# Patient Record
Sex: Female | Born: 1955
Health system: Southern US, Community
[De-identification: ages and names within clinical notes are randomized; demographics above are authoritative.]

## PROBLEM LIST (undated history)

## (undated) DIAGNOSIS — F419 Anxiety disorder, unspecified: Secondary | ICD-10-CM

## (undated) DIAGNOSIS — F32A Depression, unspecified: Secondary | ICD-10-CM

## (undated) DIAGNOSIS — K219 Gastro-esophageal reflux disease without esophagitis: Secondary | ICD-10-CM

## (undated) DIAGNOSIS — C801 Malignant (primary) neoplasm, unspecified: Secondary | ICD-10-CM

## (undated) DIAGNOSIS — G4730 Sleep apnea, unspecified: Secondary | ICD-10-CM

## (undated) DIAGNOSIS — I10 Essential (primary) hypertension: Secondary | ICD-10-CM

## (undated) DIAGNOSIS — F329 Major depressive disorder, single episode, unspecified: Secondary | ICD-10-CM

## (undated) DIAGNOSIS — M1990 Unspecified osteoarthritis, unspecified site: Secondary | ICD-10-CM

## (undated) DIAGNOSIS — E119 Type 2 diabetes mellitus without complications: Secondary | ICD-10-CM

## (undated) DIAGNOSIS — I2510 Atherosclerotic heart disease of native coronary artery without angina pectoris: Secondary | ICD-10-CM

## (undated) DIAGNOSIS — I4891 Unspecified atrial fibrillation: Principal | ICD-10-CM

## (undated) DIAGNOSIS — E785 Hyperlipidemia, unspecified: Secondary | ICD-10-CM

## (undated) HISTORY — DX: Malignant (primary) neoplasm, unspecified: C80.1

## (undated) HISTORY — DX: Depression, unspecified: F32.A

## (undated) HISTORY — DX: Sleep apnea, unspecified: G47.30

## (undated) HISTORY — DX: Major depressive disorder, single episode, unspecified: F32.9

---

## 1978-04-04 HISTORY — PX: TONSILLECTOMY: SUR1361

## 2005-07-03 DIAGNOSIS — C801 Malignant (primary) neoplasm, unspecified: Secondary | ICD-10-CM

## 2005-07-03 HISTORY — DX: Malignant (primary) neoplasm, unspecified: C80.1

## 2005-12-03 HISTORY — PX: BREAST SURGERY: SHX581

## 2009-01-28 ENCOUNTER — Ambulatory Visit: Payer: Self-pay | Admitting: Unknown Physician Specialty

## 2009-09-13 ENCOUNTER — Ambulatory Visit: Payer: Self-pay | Admitting: Unknown Physician Specialty

## 2010-12-14 LAB — EJECTION FRACTION PERCENTAGE: Left Ventricular Ejection Fraction: 60 %

## 2011-08-23 ENCOUNTER — Encounter: Payer: Self-pay | Admitting: Family

## 2011-09-03 ENCOUNTER — Encounter: Payer: Self-pay | Admitting: Family

## 2012-02-07 ENCOUNTER — Ambulatory Visit: Payer: Self-pay | Admitting: Family

## 2012-03-04 ENCOUNTER — Ambulatory Visit: Payer: Self-pay | Admitting: Family

## 2012-04-04 ENCOUNTER — Ambulatory Visit: Payer: Self-pay | Admitting: Family

## 2013-01-18 ENCOUNTER — Ambulatory Visit: Payer: Self-pay | Admitting: Unknown Physician Specialty

## 2013-04-04 HISTORY — PX: COLONOSCOPY: SHX174

## 2013-09-03 ENCOUNTER — Ambulatory Visit: Payer: Self-pay | Admitting: Gastroenterology

## 2013-09-05 LAB — PATHOLOGY REPORT

## 2014-05-12 ENCOUNTER — Encounter: Payer: Self-pay | Admitting: General Surgery

## 2014-05-12 ENCOUNTER — Ambulatory Visit (INDEPENDENT_AMBULATORY_CARE_PROVIDER_SITE_OTHER): Payer: 59 | Admitting: General Surgery

## 2014-05-12 VITALS — BP 132/76 | HR 74 | Resp 14 | Ht 63.0 in | Wt 249.0 lb

## 2014-05-12 DIAGNOSIS — Z853 Personal history of malignant neoplasm of breast: Secondary | ICD-10-CM

## 2014-05-12 NOTE — Patient Instructions (Signed)
Return in 3 months for office visit.

## 2014-05-12 NOTE — Progress Notes (Signed)
Patient ID: Tammy Webb, female   DOB: 06/21/1955, 59 y.o.   MRN: [id number]  Chief Complaint  Patient presents with  . Other    right breast nodule    HPI Tammy Webb is a 59 y.o. female  Here for assessment of right breast nodule. She has a history of  right breast cancer treated with chemotherapy, bilateral mastectomies, and subsequent reconstruction. She first noticed the nodule two months ago, she thinks the area has got bigger. No pain.  HPI  Past Medical History  Diagnosis Date  . Cancer 07/2005    Left Breast     Past Surgical History  Procedure Laterality Date  . Tonsillectomy  1980  . Cesarean section  1993  . Breast surgery Bilateral 12/2005    Mastectomies with reconstruction    Family History  Problem Relation Age of Onset  . Breast cancer Maternal Aunt   . Ovarian cancer Paternal Aunt     Social History History  Substance Use Topics  . Smoking status: Never Smoker   . Smokeless tobacco: Not on file  . Alcohol Use: No    Allergies  Allergen Reactions  . Morphine And Related Rash    Current Outpatient Prescriptions  Medication Sig Dispense Refill  . ADVAIR DISKUS 250-50 MCG/DOSE AEPB     . EPIPEN 2-PAK 0.3 MG/0.3ML SOAJ injection     . ibuprofen (ADVIL,MOTRIN) 200 MG tablet Take 200 mg by mouth every 6 (six) hours as needed.    Marland Kitchen losartan (COZAAR) 100 MG tablet     . Multiple Vitamins-Minerals (MULTIVITAMIN WITH MINERALS) tablet Take 1 tablet by mouth daily.     No current facility-administered medications for this visit.    Review of Systems Review of Systems  Constitutional: Negative.   HENT: Negative.   Respiratory: Negative.     Blood pressure 132/76, pulse 74, resp. rate 14, height 5\' 3"  (1.6 m), weight 249 lb (112.946 kg).  Physical Exam Physical Exam  Constitutional: She is oriented to person, place, and time. She appears well-developed and well-nourished.  Eyes: Conjunctivae are normal. No scleral icterus.  Neck: Neck supple.   Cardiovascular: Normal rate, regular rhythm and normal heart sounds.   Pulmonary/Chest: Effort normal and breath sounds normal.  Abdominal: Soft. Bowel sounds are normal. There is no hepatomegaly.  Lymphadenopathy:    She has no cervical adenopathy.    She has no axillary adenopathy.  Neurological: She is alert and oriented to person, place, and time.  Skin: Skin is warm and dry.  Breast exam- implants noted on both sides -they both are shifted more laterally. The sternum is broad with slight carinum deformity.  On either side of this there is mild irregular feel and the costal cartilages are easily palpable. No defined mass or lump noted.  Data Reviewed Prior information from her initial cancer treatment. Pt had a 2 cm left breast CA-ER/PR pos, her 2 neg. She had left MRM, right mastectomy and bil implants. Also had chemo and 61yrs of antihormonal therapy.  Assessment    Area of nodularity in the medial right breast area- reveals no defined mass or lump. Pt reassured. History of right breast cancer.     Plan   Will recheck in 2-3 mos. Also requested CA 27-29        SANKAR,SEEPLAPUTHUR G 05/13/2014, 8:11 AM

## 2014-05-13 ENCOUNTER — Telehealth: Payer: Self-pay | Admitting: *Deleted

## 2014-05-13 ENCOUNTER — Encounter: Payer: Self-pay | Admitting: General Surgery

## 2014-05-13 LAB — CANCER ANTIGEN 27.29: CA 27.29: 12.2 U/mL (ref 0.0–38.6)

## 2014-05-13 NOTE — Progress Notes (Signed)
Quick Note:  Inform pt labs are normal. F/u as scheduled ______ 

## 2014-05-13 NOTE — Telephone Encounter (Signed)
-----   Message from Christene Lye, MD sent at 05/13/2014  7:13 AM EST ----- Inform pt labs are normal. F/u as scheduled

## 2014-05-14 NOTE — Telephone Encounter (Signed)
Notified patient as instructed, patient pleased. Discussed follow-up appointments, patient agrees  

## 2014-08-14 ENCOUNTER — Ambulatory Visit: Payer: Self-pay | Admitting: General Surgery

## 2015-02-23 NOTE — Telephone Encounter (Signed)
Pt called said that she will be going out of town on Wed, needs refill on Lipitor, [facility] [place]

## 2015-02-24 MED ORDER — ATORVASTATIN CALCIUM 40 MG PO TABS
40 MG | ORAL_TABLET | Freq: Every evening | ORAL | 1 refills | Status: DC
Start: 2015-02-24 — End: 2015-07-31

## 2015-03-17 ENCOUNTER — Encounter: Attending: Cardiovascular Disease | Primary: Family Medicine

## 2015-03-23 ENCOUNTER — Ambulatory Visit
Admit: 2015-03-23 | Discharge: 2015-03-23 | Payer: PRIVATE HEALTH INSURANCE | Attending: Cardiovascular Disease | Primary: Family Medicine

## 2015-03-23 DIAGNOSIS — I10 Essential (primary) hypertension: Secondary | ICD-10-CM

## 2015-03-23 MED ORDER — NITROGLYCERIN 0.4 MG SL SUBL
0.4 MG | ORAL_TABLET | SUBLINGUAL | 3 refills | Status: DC | PRN
Start: 2015-03-23 — End: 2018-05-21

## 2015-03-23 NOTE — Telephone Encounter (Signed)
Patient request new SL Nitro ERX [facility] [place] during OV with MAI today. Message to MAI.

## 2015-03-23 NOTE — Progress Notes (Signed)
[facility] Cardiology Office Note    DATE of SERVICE: 03/23/15  TIME of SERVICE: 11:32 AM                   Reason for Visit:  Chief Complaint   Patient presents with   ??? 1 Year Follow Up        History of Present Illness:   Sally MontgomeryKaren L Cotham is a 59 y.o. female who returns for follow-up of coronary artery disease status post DES to the left anterior descending in 2010.     She is doing well.  She denies chest discomfort or shortness of breath.    12-lead EKG, which I personally reviewed today, shows sinus rhythm without concerning changes.    Past Medical History:  Past Medical History   Diagnosis Date   ??? Atherosclerotic heart disease of native coronary artery without angina pectoris    ??? Chest pain, unspecified    ??? Essential (primary) hypertension    ??? Family history of ischemic heart disease and other diseases of the circulatory system    ??? GERD (gastroesophageal reflux disease)    ??? Hyperlipidemia    ??? Presence of coronary angioplasty implant and graft         Past Surgical History  Past Surgical History   Procedure Laterality Date   ??? Hysterectomy, total abdominal     ??? Coronary angioplasty with stent placement  01/2009     PCI DES LAD       Family History  Family History   Problem Relation Age of Onset   ??? Heart Attack Father      myocardial infarction at less than 9760        Social History  Social History   Substance Use Topics   ??? Smoking status: Never Smoker   ??? Smokeless tobacco: None   ??? Alcohol use No        Medications:    Current Outpatient Prescriptions:   ???  atorvastatin (LIPITOR) 40 MG tablet, Take 1 tablet by mouth nightly Take As Directed, Disp: 90 tablet, Rfl: 1  ???  aspirin 81 MG EC tablet, Take 81 mg by mouth daily Take As Directed, Disp: , Rfl:   ???  metoprolol succinate (TOPROL XL) 25 MG extended release tablet, Take 25 mg by mouth daily Take As Directed, Disp: , Rfl:   ???  sodium chloride (SALINE MIST) 0.65 % nasal spray, 1 spray by Nasal route as needed for Congestion Take As Directed, Disp: ,  Rfl:   ???  albuterol sulfate HFA 108 (90 BASE) MCG/ACT inhaler, Inhale 2 puffs into the lungs every 6 hours as needed for Wheezing, Disp: , Rfl:   ???  nitroGLYCERIN (NITROSTAT) 0.4 MG SL tablet, Place 0.4 mg under the tongue every 5 minutes as needed for Chest pain Dissolve 1 tab under tongue at first sign of chest pain. May repeat every 5 minutes until relief is obtained. If pain persists after taking 3 tabs in a 15-minute period, or the pain is different than is typically experienced, call 9-1-1 immediately., Disp: , Rfl:     Review of Systems:  Review of Systems   Constitutional: Negative for diaphoresis, fatigue and fever.   HENT: Negative for congestion and nosebleeds.    Eyes: Negative for visual disturbance.   Respiratory: Negative for cough, shortness of breath and wheezing.    Cardiovascular: Negative for chest pain, palpitations and leg swelling.   Gastrointestinal: Negative for abdominal pain, blood in  stool, nausea and vomiting.   Genitourinary: Negative for hematuria.   Musculoskeletal: Positive for myalgias. Negative for arthralgias.   Skin: Negative for rash.   Neurological: Negative for dizziness and syncope.   Hematological: Does not bruise/bleed easily.   Psychiatric/Behavioral: Negative for dysphoric mood.        Denies Depression       Physical Examination:  Vitals: Blood pressure 132/72, pulse 67, height 5' (1.524 m), weight 178 lb 1.6 oz (80.8 kg). Body mass index is 34.78 kg/(m^2).  Physical Exam   Constitutional: She is oriented to person, place, and time. She appears well-developed and well-nourished.   HENT:   Head: Normocephalic.   Eyes: Conjunctivae are normal. No scleral icterus.   Neck: No JVD present.   Cardiovascular: Regular rhythm, S1 normal, S2 normal and intact distal pulses.  PMI is not displaced.  Exam reveals no S3 and no S4.    No murmur heard.  Pulmonary/Chest: Breath sounds normal. She has no wheezes. She has no rales.   Abdominal: Soft. Bowel sounds are normal. There is no  tenderness.   Musculoskeletal: She exhibits no edema.   Neurological: She is alert and oriented to person, place, and time.   Skin: Skin is warm and dry. No rash noted.   Psychiatric: She has a normal mood and affect.       Laboratory Tests:   No results found for: NA, K, CL, CO2, BUN, CREATININE, GLUCOSE, CALCIUM   No results found for: NA, K, CL, CO2, BUN, CREATININE, GLUCOSE, CALCIUM, PROT, LABALBU, BILITOT, ALKPHOS, AST, ALT, LABGLOM, GFRAA, AGRATIO, GLOB      "No results found for: CHOL  No results found for: TRIG  No results found for: HDL  No results found for: LDLCALC, LDLCHOLESTEROL  No results found for: LABVLDL, VLDL  No results found for: CHOLHDLRATIO"      Cardiac Tests:    Echo (date: 12/14/2010):  Normal stress echo      Cardiac Catheterization (date:  01/19/2009): Single-vessel disease, successful DES to the mid left anterior descending          Assessment and Plan:  1.  Coronary artery disease.  She is status post left anterior descending stenting in 2010.  She has no angina pectoris.  Continue aggressive secondary prevention measures.  She has some muscle soreness that she is concerned might be due to her statin.  I suggested trying coenzyme Q10.         Garland Hincapie A. Milinda Antis, MD, [facility]

## 2015-07-30 ENCOUNTER — Encounter

## 2015-07-30 NOTE — Telephone Encounter (Signed)
Pt is calling for a [facility] [place] [address]

## 2015-07-31 MED ORDER — ATORVASTATIN CALCIUM 40 MG PO TABS
40 | ORAL_TABLET | Freq: Every evening | ORAL | 1 refills | Status: DC
Start: 2015-07-31 — End: 2016-02-22

## 2015-07-31 NOTE — Telephone Encounter (Signed)
Called patient and left message that when she is on Lipitor we must check her liver function every 6 months.  Therefore, we are sending an order in the mail and this needs done asap.

## 2015-07-31 NOTE — Telephone Encounter (Signed)
Mailed lab order to patient.

## 2015-08-11 LAB — HEPATIC FUNCTION PANEL
ALT: 28 U/L
AST: 16 U/L
Albumin: 4
Alkaline Phosphatase: 73 U/L
Bilirubin, Direct: 0.3 mg/dL
Total Bilirubin: 0.8 mg/dL (ref 0.1–1.4)
Total Protein: 7.3 g/dL

## 2015-08-11 LAB — LIPID PANEL
Chol/HDL Ratio: 1
Cholesterol, Total: 113 mg/dL
HDL: 80 mg/dL — AB (ref 35–70)
LDL Calculated: 19 mg/dL (ref 0–160)
Triglycerides: 70 mg/dL

## 2015-09-02 NOTE — Telephone Encounter (Signed)
Patient called asking if office received labs she had done earlier this month at pcp's office.  Nothing in chart or folder.  Called for results, received fax and put copy in Linda's folder to review.  Patient would like a copy mailed to her.

## 2015-09-03 NOTE — Telephone Encounter (Signed)
Called pt with L&Ls. The hepatic panel was normal and lipids at goal.  The HDL was 80 with an LDL of 19.  TC was 113.  Patient states she notices some slight myalgias and asked if she could take it every other day.  I told her to try this and I will let Dr. Milinda AntisIler know. I also told her to call us if the myalgias do not get better or worsen. She verbalized understanding.  We will check her L&Ls with a CPK in 3 mos.

## 2015-09-07 NOTE — Telephone Encounter (Signed)
Lab results already called to pt and sent to Dr. Milinda AntisIler.

## 2015-09-11 NOTE — Telephone Encounter (Signed)
Patient called and said she has not received copy of recent labs she requested over a week ago and asked if another copy could be mailed to her and this was done.

## 2015-09-15 ENCOUNTER — Telehealth

## 2015-09-15 NOTE — Telephone Encounter (Signed)
Patient needs her next lab orders sent to her.

## 2015-09-16 NOTE — Telephone Encounter (Signed)
Lab order put in Linda's folder.

## 2015-09-17 MED ORDER — METOPROLOL SUCCINATE ER 25 MG PO TB24
25 MG | ORAL_TABLET | ORAL | 3 refills | Status: DC
Start: 2015-09-17 — End: 2016-11-06

## 2015-09-17 NOTE — Telephone Encounter (Signed)
Lab orders, with note to get them done in November, mailed to pt.

## 2015-09-17 NOTE — Telephone Encounter (Signed)
Had labs in May, 2017

## 2015-09-18 NOTE — Telephone Encounter (Signed)
error 

## 2015-11-05 ENCOUNTER — Encounter: Payer: Self-pay | Admitting: Obstetrics and Gynecology

## 2015-11-05 ENCOUNTER — Ambulatory Visit (INDEPENDENT_AMBULATORY_CARE_PROVIDER_SITE_OTHER): Payer: 59 | Admitting: Obstetrics and Gynecology

## 2015-11-05 VITALS — BP 116/71 | HR 60 | Wt 234.2 lb

## 2015-11-05 DIAGNOSIS — B372 Candidiasis of skin and nail: Secondary | ICD-10-CM

## 2015-11-05 DIAGNOSIS — N898 Other specified noninflammatory disorders of vagina: Secondary | ICD-10-CM | POA: Diagnosis not present

## 2015-11-05 DIAGNOSIS — R8290 Unspecified abnormal findings in urine: Secondary | ICD-10-CM | POA: Diagnosis not present

## 2015-11-05 DIAGNOSIS — Z853 Personal history of malignant neoplasm of breast: Secondary | ICD-10-CM

## 2015-11-05 DIAGNOSIS — N950 Postmenopausal bleeding: Secondary | ICD-10-CM | POA: Diagnosis not present

## 2015-11-05 LAB — POCT URINALYSIS DIPSTICK
Bilirubin, UA: NEGATIVE
Blood, UA: NEGATIVE
GLUCOSE UA: NEGATIVE
KETONES UA: NEGATIVE
Leukocytes, UA: NEGATIVE
Nitrite, UA: NEGATIVE
Protein, UA: NEGATIVE
Urobilinogen, UA: 0.2
pH, UA: 7

## 2015-11-05 NOTE — Progress Notes (Signed)
GYNECOLOGY CLINIC PROGRESS NOTE  Subjective:    Tammy Webb is a 60 y.o. [phone] post-menopausal female (~ 9 years) who presents to establish care, and for concerns regarding vaginal bleeding. She has been menopausal for ~ 15 years. Currently on no HRT.  Bleeding is described as less flow than a normal period and has occurred 1 time, approximately 4 months ago. Other menopausal symptoms include: none. Workup to date: none.  Menstrual History: Menarche age: 72 No LMP recorded. Patient is postmenopausal.  Last pap smear last year, May 2016 (with Dr. Rebecka Apley). Denies h/o abnormal pap smears.  H/o sigmoidoscopy 3-4 years ago. H/o colon polyps.     Past Medical History:  Diagnosis Date  . Cancer ([place]) 07/2005   Left Breast   . Sleep apnea     Family History  Problem Relation Age of Onset  . Breast cancer Maternal Aunt   . Ovarian cancer Paternal Aunt    Past Surgical History:  Procedure Laterality Date  . BREAST SURGERY Bilateral 12/2005   Mastectomies with reconstruction  . CESAREAN SECTION  1993  . TONSILLECTOMY  1980    Social History   Social History  . Marital status: Divorced    Spouse name: N/A  . Number of children: N/A  . Years of education: N/A   Occupational History  . Not on file.   Social History Main Topics  . Smoking status: Never Smoker  . Smokeless tobacco: Never Used  . Alcohol use No  . Drug use: No  . Sexual activity: Not Currently    Birth control/ protection: Post-menopausal   Other Topics Concern  . Not on file   Social History Narrative  . No narrative on file    Current Outpatient Prescriptions on File Prior to Visit  Medication Sig Dispense Refill  . ADVAIR DISKUS 250-50 MCG/DOSE AEPB     . EPIPEN 2-PAK 0.3 MG/0.3ML SOAJ injection     . ibuprofen (ADVIL,MOTRIN) 200 MG tablet Take 200 mg by mouth every 6 (six) hours as needed.    Marland Kitchen losartan (COZAAR) 100 MG tablet     . Multiple Vitamins-Minerals (MULTIVITAMIN WITH MINERALS)  tablet Take 1 tablet by mouth daily.     No current facility-administered medications on file prior to visit.     Allergies  Allergen Reactions  . Morphine And Related Rash     Review of Systems A comprehensive review of systems was negative except for: Genitourinary: positive for vaginal discharge and vaginal odor for ~ 1 year, and urinary odor. Integument/breast: positive for skin rash occuring intermittently beneath pannus and groin folds    Objective:    BP 116/71   Pulse 60   Wt 234 lb 3.2 oz (106.2 kg)   BMI 41.49 kg/m   General appearance: alert and no distress, morbidly obese Neck: no adenopathy, no carotid bruit, no JVD, supple, symmetrical, trachea midline and thyroid not enlarged, symmetric, no tenderness/mass/nodules Back: symmetric, no curvature. ROM normal. No CVA tenderness. Lungs: clear to auscultation bilaterally Breasts: negative findings: skin normal, palpation negative for masses or nodules, no palpable axillary lymphadenopathy, positive findings: implants bilaterally, s/p bilateral mastectomy changes. Heart: regular rate and rhythm, S1, S2 normal, no murmur, click, rub or gallop Abdomen: soft, non-tender; bowel sounds normal; no masses,  no organomegaly Pelvic: external genitalia normal, rectovaginal septum normal.  Vagina with small amount of thin white discharge. Mild atrophy present.  Cervix normal appearing, no lesions and no motion tenderness.  Uterus mobile, nontender, normal  shape and size.  Adnexae non-palpable, nontender bilaterally.  Extremities: extremities normal, atraumatic, no cyanosis or edema Skin: Skin color, texture, turgor normal. No rashes or lesions Lymph nodes: Cervical, supraclavicular, and axillary nodes normal. Neurologic: Grossly normal     Labs:  Results for orders placed or performed in visit on 11/05/15  POCT urinalysis dipstick  Result Value Ref Range   Color, UA yellow    Clarity, UA clear    Glucose, UA neg    Bilirubin,  UA neg    Ketones, UA neg    Spec Grav, UA <=1.005    Blood, UA neg    pH, UA 7.0    Protein, UA neg    Urobilinogen, UA 0.2    Nitrite, UA neg    Leukocytes, UA Negative Negative    Assessment:    Postmenopausal bleeding    Vaginal discharge with odor History of breast cancer Urinary odor  Plan:   - Discussed etiologies of postmenopausal bleeding, concern about precancerous/hyperplasia or cancerous etiology (5 to 10% percent of cases). Also discussed role of unopposed estrogen exposure in leading to thickened or proliferative endometrium; and its possible correlation with endometrial hyperplasia/carcinoma.  Discussed that obesity is linked to endometrial pathology given that adipose cells produce extra estrogen (estrone) which can cause the endometrium to have a significant amount of estrogen exposure.  However, she was reassured that endometrial atrophy and endometrial polyps are the most common causes of postmenopausal bleeding.  Uterine bleeding in postmenopausal women is usually light and self-limited. Exclusion of cancer is the main objective; therefore, treatment is usually unnecessary once cancer has been excluded.  The primary goal in the diagnostic evaluation of postmenopausal women with uterine bleeding is to exclude malignancy.  Will order pelvic ultrasound to assess endometrium.  If thickened, will proceed with further evaluation with endometrial biopsy.  Will notify patient of results of ultrasound by phone.  - Vaginal discharge present.  Sent Nuswab.  - History of breast cancer. Patient has been seen by General Surgery within past year. Has been released from Oncology.  - Urinary odor, UA performed today, wnl. - H/o yeast infections under skin folds, patient notes that she has been given a prescription for Nystatin powder and cream which she uses as needed.  Recommended using daily, especially in the summer months.  Exam today notes no skin rash.   A total of 45 minutes were  spent face-to-face with the patient during the encounter with greater than 50% dealing with counseling and coordination of care.   Rubie Maid, MD [facility]

## 2015-11-08 LAB — NUSWAB VAGINITIS PLUS (VG+)
CANDIDA GLABRATA, NAA: NEGATIVE
CHLAMYDIA TRACHOMATIS, NAA: NEGATIVE
Candida albicans, NAA: NEGATIVE
NEISSERIA GONORRHOEAE, NAA: NEGATIVE
TRICH VAG BY NAA: NEGATIVE

## 2015-11-09 ENCOUNTER — Telehealth: Payer: Self-pay

## 2015-11-09 NOTE — Telephone Encounter (Signed)
-----   Message from Rubie Maid, MD sent at 11/09/2015  1:38 PM EDT ----- Please inform patient that vaginal swab returned negative.

## 2015-11-09 NOTE — Telephone Encounter (Signed)
Called pt, no answer. LM for pt to call back.  

## 2015-11-10 NOTE — Telephone Encounter (Signed)
Called pt unable to leave message as voicemail is full.

## 2015-11-11 ENCOUNTER — Ambulatory Visit (INDEPENDENT_AMBULATORY_CARE_PROVIDER_SITE_OTHER): Payer: 59

## 2015-11-11 DIAGNOSIS — N950 Postmenopausal bleeding: Secondary | ICD-10-CM | POA: Diagnosis not present

## 2015-11-13 ENCOUNTER — Telehealth: Payer: Self-pay | Admitting: Obstetrics and Gynecology

## 2015-11-13 NOTE — Telephone Encounter (Signed)
Called pt, no answer. LM for pt informing her of previous calls as well as negative results.

## 2015-11-13 NOTE — Telephone Encounter (Signed)
PT IS CALLING WANTING TO KNOW THE RESULTS OF HER PAP FROM LAST WEEK, SHE STATED THAT YOU CAN CALL AND LEAVE ON HER VM AT NUMBER ABOVE.

## 2016-02-19 LAB — BASIC METABOLIC PANEL
BUN: 11 mg/dL
CO2: 27 mmol/L
Calcium: 9 mg/dL
Chloride: 104 mmol/L
Creatinine: 0.76
Gfr Calculated: >60
Glucose: 87 mg/dL
Potassium: 3.8 mmol/L
Sodium: 139 mmol/L

## 2016-02-19 LAB — LIPID PANEL
Chol/HDL Ratio: 1
Chol/HDL Ratio: 1
Cholesterol, Total: 107 mg/dL
Cholesterol, Total: 107 mg/dL
HDL: 74 mg/dL — AB (ref 35–70)
HDL: 74 mg/dL — ABNORMAL HIGH (ref 35–70)
LDL Calculated: 18 mg/dL (ref 0–160)
LDL Calculated: 18 mg/dL (ref 0–160)
Triglycerides: 77 mg/dL
Triglycerides: 77 mg/dL

## 2016-02-19 LAB — COMPREHENSIVE METABOLIC PANEL
ALT: 36 U/L
AST: 20 U/L
Albumin: 4
Alkaline Phosphatase: 73 U/L
Anion Gap: 8 mmol/L
BUN: 11 mg/dL
CO2: 27 mmol/L
Calcium: 9 mg/dL
Chloride: 104 mmol/L
Creatinine: 0.76
Gfr Calculated: >60
Glucose: 87 mg/dL
Potassium: 3.8 mmol/L
Sodium: 139 mmol/L
Total Bilirubin: 0.8 mg/dL (ref 0.1–1.4)
Total Protein: 7.3

## 2016-02-19 LAB — CK
Total CK: 113 U/L
Total CK: 113 U/L

## 2016-02-22 NOTE — Telephone Encounter (Signed)
Pt called said that she had labs drawn [facility] [place] and would like copy mailed to her. She also said that she only take Lipitor ev other day due to muscle aches, was advised by pharmacist that our ofc declined refill for her. She said she did not need refill and that it was auto refill by pharmacy.

## 2016-02-29 NOTE — Telephone Encounter (Signed)
-----   Message from Armandina StammerMark A Iler, MD sent at 02/23/2016  4:23 PM EST -----  Reviewed results.  Please forward to patient if not already done.

## 2016-02-29 NOTE — Telephone Encounter (Signed)
Called pt with normal CMP and lipids with lipids at goal. She would like a copy of the labs mailed to her.  I also reminded her that she has an appt on December 28th.

## 2016-03-01 NOTE — Telephone Encounter (Signed)
Mailed copy of recent labs to patient.

## 2016-03-31 ENCOUNTER — Encounter: Attending: Cardiovascular Disease | Primary: Family Medicine

## 2016-04-05 ENCOUNTER — Ambulatory Visit
Admit: 2016-04-05 | Discharge: 2016-04-05 | Payer: PRIVATE HEALTH INSURANCE | Attending: Cardiovascular Disease | Primary: Family Medicine

## 2016-04-05 DIAGNOSIS — I10 Essential (primary) hypertension: Secondary | ICD-10-CM

## 2016-04-05 NOTE — Progress Notes (Signed)
[facility] Cardiology Office Note    DATE of SERVICE: 04/05/16    TIME of SERVICE: 3:06 PM                   Reason for Visit:  Chief Complaint   Patient presents with   ??? 1 Year Follow Up        History of Present Illness:   Sally MontgomeryKaren L Nicolini is a 61 y.o. female who returns for follow-up of coronary artery disease status post DES to the left anterior descending in 2010.  She is doing well.  She denies chest discomfort or shortness of breath.    12-lead EKG, which I personally reviewed today, shows sinus rhythm without concerning changes.    Past Medical History:  Past Medical History:   Diagnosis Date   ??? Atherosclerotic heart disease of native coronary artery without angina pectoris    ??? Chest pain, unspecified    ??? Essential (primary) hypertension    ??? Family history of ischemic heart disease and other diseases of the circulatory system    ??? GERD (gastroesophageal reflux disease)    ??? Hyperlipidemia    ??? Presence of coronary angioplasty implant and graft         Past Surgical History  Past Surgical History:   Procedure Laterality Date   ??? CORONARY ANGIOPLASTY WITH STENT PLACEMENT  01/2009    PCI DES LAD   ??? HYSTERECTOMY, TOTAL ABDOMINAL         Family History  Family History   Problem Relation Age of Onset   ??? Heart Attack Father      myocardial infarction at less than 1960        Social History  Social History   Substance Use Topics   ??? Smoking status: Never Smoker   ??? Smokeless tobacco: Never Used   ??? Alcohol use No        Medications:    Current Outpatient Prescriptions:   ???  atorvastatin (LIPITOR) 40 MG tablet, Take 40 mg by mouth every other day, Disp: , Rfl:   ???  metoprolol succinate (TOPROL XL) 25 MG extended release tablet, TAKE 1 TABLET BY MOUTH EVERY DAY, Disp: 90 tablet, Rfl: 3  ???  aspirin 81 MG EC tablet, Take 81 mg by mouth daily Take As Directed, Disp: , Rfl:   ???  sodium chloride (SALINE MIST) 0.65 % nasal spray, 1 spray by Nasal route as needed for Congestion Take As Directed, Disp: , Rfl:   ???   nitroGLYCERIN (NITROSTAT) 0.4 MG SL tablet, Place 1 tablet under the tongue every 5 minutes as needed for Chest pain Dissolve 1 tab under tongue at first sign of chest pain. May repeat every 5 minutes until relief is obtained. If pain persists after taking 3 tabs in a 15-minute period,  call 9-1-1 immediately., Disp: 25 tablet, Rfl: 3    Review of Systems:  Review of Systems   Constitutional: Negative for diaphoresis, fatigue and fever.   HENT: Negative for congestion and nosebleeds.    Eyes: Negative for visual disturbance.   Respiratory: Negative for cough, shortness of breath and wheezing.    Cardiovascular: Negative for chest pain, palpitations and leg swelling.   Gastrointestinal: Negative for abdominal pain, blood in stool, nausea and vomiting.   Genitourinary: Negative for hematuria.   Musculoskeletal: Positive for myalgias. Negative for arthralgias.   Skin: Negative for rash.   Neurological: Negative for dizziness and syncope.   Hematological: Does not bruise/bleed  easily.   Psychiatric/Behavioral: Negative for dysphoric mood.        Denies Depression       Physical Examination:  Vitals: Blood pressure 138/88, pulse 75, height 5' (1.524 m), weight 176 lb 3.2 oz (79.9 kg). Body mass index is 34.41 kg/m??.  Physical Exam   Constitutional: She is oriented to person, place, and time. She appears well-developed and well-nourished.   HENT:   Head: Normocephalic.   Eyes: Conjunctivae are normal. No scleral icterus.   Neck: No JVD present.   Cardiovascular: Regular rhythm, S1 normal, S2 normal and intact distal pulses.  PMI is not displaced.  Exam reveals no S3 and no S4.    No murmur heard.  Pulmonary/Chest: Breath sounds normal. She has no wheezes. She has no rales.   Abdominal: Soft. Bowel sounds are normal. There is no tenderness.   Musculoskeletal: She exhibits no edema.   Neurological: She is alert and oriented to person, place, and time.   Skin: Skin is warm and dry. No rash noted.   Psychiatric: She has a  normal mood and affect.       Laboratory Tests:   Lab Results   Component Value Date    NA 139 02/19/2016    NA 139 02/19/2016    K 3.8 02/19/2016    K 3.8 02/19/2016    CL 104 02/19/2016    CL 104 02/19/2016    CO2 27 02/19/2016    CO2 27 02/19/2016    BUN 11 02/19/2016    BUN 11 02/19/2016    CREATININE 0.76 02/19/2016    CREATININE 0.76 02/19/2016    GLUCOSE 87 02/19/2016    GLUCOSE 87 02/19/2016    CALCIUM 9.0 02/19/2016    CALCIUM 9.0 02/19/2016      Lab Results   Component Value Date    NA 139 02/19/2016    NA 139 02/19/2016    K 3.8 02/19/2016    K 3.8 02/19/2016    CL 104 02/19/2016    CL 104 02/19/2016    CO2 27 02/19/2016    CO2 27 02/19/2016    BUN 11 02/19/2016    BUN 11 02/19/2016    CREATININE 0.76 02/19/2016    CREATININE 0.76 02/19/2016    GLUCOSE 87 02/19/2016    GLUCOSE 87 02/19/2016    CALCIUM 9.0 02/19/2016    CALCIUM 9.0 02/19/2016    PROT 7.3 08/11/2015    LABALBU 4.0 02/19/2016    BILITOT 0.8 02/19/2016    ALKPHOS 73 02/19/2016    AST 20 02/19/2016    ALT 36 02/19/2016    LABGLOM >60 02/19/2016    LABGLOM >60 02/19/2016         "  Lab Results   Component Value Date    CHOL 107 02/19/2016    CHOL 107 02/19/2016    CHOL 113 08/11/2015     Lab Results   Component Value Date    TRIG 77 02/19/2016    TRIG 77 02/19/2016    TRIG 70 08/11/2015     Lab Results   Component Value Date    HDL 74 (A) 02/19/2016    HDL 74 (H) 02/19/2016    HDL 80 (A) 08/11/2015     Lab Results   Component Value Date    LDLCALC 18 02/19/2016    LDLCALC 18 02/19/2016    LDLCALC 19 08/11/2015       Cardiac Tests:    Echo (date: 12/14/2010):  Normal stress  echo      Cardiac Catheterization (date:  01/19/2009): Single-vessel disease, successful DES to the mid left anterior descending          Assessment and Plan:  1.  Coronary artery disease.  She is status post left anterior descending stenting in 2010.  She has no angina pectoris.  Continue aggressive secondary prevention measures.  She developed myalgias on daily  atorvastatin.  These are improved by taking the medicine every other day and also taking some coenzyme Q10.  Her numbers are excellent as above.         Kharma Sampsel A. Milinda Antis, MD, [facility]

## 2016-04-07 DIAGNOSIS — J301 Allergic rhinitis due to pollen: Secondary | ICD-10-CM | POA: Diagnosis not present

## 2016-04-13 DIAGNOSIS — I208 Other forms of angina pectoris: Secondary | ICD-10-CM | POA: Diagnosis not present

## 2016-04-13 DIAGNOSIS — J301 Allergic rhinitis due to pollen: Secondary | ICD-10-CM | POA: Diagnosis not present

## 2016-04-19 DIAGNOSIS — E119 Type 2 diabetes mellitus without complications: Secondary | ICD-10-CM | POA: Diagnosis not present

## 2016-04-28 DIAGNOSIS — J301 Allergic rhinitis due to pollen: Secondary | ICD-10-CM | POA: Diagnosis not present

## 2016-04-28 DIAGNOSIS — M7050 Other bursitis of knee, unspecified knee: Secondary | ICD-10-CM | POA: Diagnosis not present

## 2016-04-28 DIAGNOSIS — M25561 Pain in right knee: Secondary | ICD-10-CM | POA: Diagnosis not present

## 2016-05-02 NOTE — Telephone Encounter (Signed)
Name of Caller: Sally Wong    Contact phone number: [phone] (home)     Relationship to Patient: patient                                                                                     Chief Complaint/Reason for Call: Called to inform doctor that her script for atorvastatin requires a PA. Stated she will be out of medication in a few more days.    Best time of day caller can be reached: Anytime    Did this call require a physician to be paged?: No    If Yes who did you page? NA    Patient advised that office/PCP has 24-48 business hours to return their call: Yes

## 2016-05-03 DIAGNOSIS — Z7689 Persons encountering health services in other specified circumstances: Secondary | ICD-10-CM | POA: Diagnosis not present

## 2016-05-03 NOTE — Telephone Encounter (Signed)
error 

## 2016-05-05 MED ORDER — ATORVASTATIN CALCIUM 40 MG PO TABS
40 | ORAL_TABLET | ORAL | 1 refills | Status: DC
Start: 2016-05-05 — End: 2016-09-04

## 2016-05-09 DIAGNOSIS — J301 Allergic rhinitis due to pollen: Secondary | ICD-10-CM | POA: Diagnosis not present

## 2016-05-18 DIAGNOSIS — G4733 Obstructive sleep apnea (adult) (pediatric): Secondary | ICD-10-CM | POA: Diagnosis not present

## 2016-05-25 ENCOUNTER — Encounter: Payer: Self-pay | Admitting: Obstetrics and Gynecology

## 2016-05-25 ENCOUNTER — Ambulatory Visit (INDEPENDENT_AMBULATORY_CARE_PROVIDER_SITE_OTHER): Payer: 59 | Admitting: Obstetrics and Gynecology

## 2016-05-25 VITALS — BP 140/78 | HR 61 | Ht 63.0 in | Wt 233.4 lb

## 2016-05-25 DIAGNOSIS — Z01419 Encounter for gynecological examination (general) (routine) without abnormal findings: Secondary | ICD-10-CM

## 2016-05-25 DIAGNOSIS — Z124 Encounter for screening for malignant neoplasm of cervix: Secondary | ICD-10-CM | POA: Diagnosis not present

## 2016-05-25 DIAGNOSIS — E6609 Other obesity due to excess calories: Secondary | ICD-10-CM

## 2016-05-25 DIAGNOSIS — Z6841 Body Mass Index (BMI) 40.0 and over, adult: Secondary | ICD-10-CM

## 2016-05-25 DIAGNOSIS — Z9882 Breast implant status: Secondary | ICD-10-CM | POA: Diagnosis not present

## 2016-05-25 DIAGNOSIS — Z853 Personal history of malignant neoplasm of breast: Secondary | ICD-10-CM | POA: Diagnosis not present

## 2016-05-25 DIAGNOSIS — N952 Postmenopausal atrophic vaginitis: Secondary | ICD-10-CM

## 2016-05-25 DIAGNOSIS — IMO0001 Reserved for inherently not codable concepts without codable children: Secondary | ICD-10-CM

## 2016-05-25 NOTE — Progress Notes (Signed)
ANNUAL PREVENTATIVE CARE GYNECOLOGY  ENCOUNTER NOTE  Subjective:       Tammy Webb is a 61 y.o. G93P1020 female here for a routine annual gynecologic exam.  Patient has a PMH of left breast cancer, s/p bilateral mastectomy with reconstruction and implants. The patient is not currently sexually active. The patient has never been taking hormone replacement therapy. Patient denies post-menopausal vaginal bleeding currently, however had complaints of PMB in August 2017 with negative workup.  The patient wears seatbelts: yes. The patient participates in regular exercise: no. Has the patient ever been transfused or tattooed?: no. The patient reports that there is not domestic violence in her life.  Current complaints: 1.  None today.    Gynecologic History No LMP recorded. Patient is postmenopausal. Contraception: post menopausal status Last Pap: 08/2014 . Results were: normal Last mammogram: ~2007.  Last Colonoscopy:  H/o sigmoidoscopy 3-4 years ago. H/o colon polyps.    Obstetric History OB History  Gravida Para Term Preterm AB Living  3 1 1   2     SAB TAB Ectopic Multiple Live Births  1            # Outcome Date GA Lbr Len/2nd Weight Sex Delivery Anes PTL Lv  3 Term 1993   8 lb 1.8 oz (3.679 kg) F CS-Unspec     2 AB           1 SAB               Past Medical History:  Diagnosis Date  . Cancer ([place]) 07/2005   Left Breast   . Sleep apnea     Family History  Problem Relation Age of Onset  . Breast cancer Maternal Aunt   . Ovarian cancer Paternal Aunt     Past Surgical History:  Procedure Laterality Date  . BREAST SURGERY Bilateral 12/2005   Mastectomies with reconstruction  . CESAREAN SECTION  1993  . TONSILLECTOMY  1980    Social History   Social History  . Marital status: Divorced    Spouse name: N/A  . Number of children: N/A  . Years of education: N/A   Occupational History  . Not on file.   Social History Main Topics  . Smoking status: Never Smoker    . Smokeless tobacco: Never Used  . Alcohol use No  . Drug use: No  . Sexual activity: Not Currently    Birth control/ protection: Post-menopausal   Other Topics Concern  . Not on file   Social History Narrative  . No narrative on file    Current Outpatient Prescriptions on File Prior to Visit  Medication Sig Dispense Refill  . ADVAIR DISKUS 250-50 MCG/DOSE AEPB     . EPIPEN 2-PAK 0.3 MG/0.3ML SOAJ injection     . ferrous sulfate 325 (65 FE) MG tablet Take by mouth.    . fluticasone (FLONASE) 50 MCG/ACT nasal spray Place into the nose.    . ibuprofen (ADVIL,MOTRIN) 200 MG tablet Take 200 mg by mouth every 6 (six) hours as needed.    Marland Kitchen losartan (COZAAR) 100 MG tablet     . Multiple Vitamins-Minerals (MULTIVITAMIN WITH MINERALS) tablet Take 1 tablet by mouth daily.    . SUPER B COMPLEX & C TABS Take by mouth.     No current facility-administered medications on file prior to visit.     Allergies  Allergen Reactions  . Triamcinolone Acetonide Hives  . Morphine And Related Rash  Review of Systems ROS Review of Systems - General ROS: negative for - chills, fatigue, fever, hot flashes, night sweats, weight gain or weight loss Psychological ROS: negative for - anxiety, decreased libido, depression, mood swings, physical abuse or sexual abuse Ophthalmic ROS: negative for - blurry vision, eye pain or loss of vision ENT ROS: negative for - headaches, hearing change, visual changes or vocal changes Allergy and Immunology ROS: negative for - hives, itchy/watery eyes or seasonal allergies Hematological and Lymphatic ROS: negative for - bleeding problems, bruising, swollen lymph nodes or weight loss Endocrine ROS: negative for - galactorrhea, hair pattern changes, hot flashes, malaise/lethargy, mood swings, palpitations, polydipsia/polyuria, skin changes, temperature intolerance or unexpected weight changes Breast ROS: negative for - new or changing breast lumps or nipple  discharge Respiratory ROS: negative for - cough or shortness of breath Cardiovascular ROS: negative for - chest pain, irregular heartbeat, palpitations or shortness of breath Gastrointestinal ROS: no abdominal pain, change in bowel habits, or black or bloody stools Genito-Urinary ROS: no dysuria, trouble voiding, or hematuria Musculoskeletal ROS: negative for - joint pain or joint stiffness Neurological ROS: negative for - bowel and bladder control changes Dermatological ROS: negative for rash and skin lesion changes   Objective:   BP 140/78 (BP Location: Left Arm, Patient Position: Sitting, Cuff Size: Large)   Pulse 61   Ht 5\' 3"  (1.6 m)   Wt 233 lb 6.4 oz (105.9 kg)   BMI 41.34 kg/m  CONSTITUTIONAL: Well-developed, well-nourished female in no acute distress.  PSYCHIATRIC: Normal mood and affect. Normal behavior. Normal judgment and thought content. [place]: Alert and oriented to person, place, and time. Normal muscle tone coordination. No cranial nerve deficit noted. HENT:  Normocephalic, atraumatic, External right and left ear normal. Oropharynx is clear and moist EYES: Conjunctivae and EOM are normal. Pupils are equal, round, and reactive to light. No scleral icterus.  NECK: Normal range of motion, supple, no masses.  Normal thyroid.  SKIN: Skin is warm and dry. No rash noted. Not diaphoretic. No erythema. No pallor. CARDIOVASCULAR: Normal heart rate noted, regular rhythm, no murmur. RESPIRATORY: Clear to auscultation bilaterally. Effort and breath sounds normal, no problems with respiration noted. BREASTS: Bilateral mastectomy scars present, implants palpable, no leakage.  ABDOMEN: Soft, normal bowel sounds, no distention noted.  No tenderness, rebound or guarding.  BLADDER: Normal PELVIC:  Bladder no bladder distension noted  Urethra: normal appearing urethra with no masses, tenderness or lesions  Vulva: normal appearing vulva with no masses, tenderness or lesions  Vagina:  atrophic, otherwise normal appearing vagina with normal color and discharge, no lesions  Cervix: normal appearing cervix without discharge or lesions  Uterus: uterus is normal size, shape, consistency and nontender  Adnexa: normal adnexa in size, nontender and no masses  RV: External Exam NormaI, No Rectal Masses and Normal Sphincter tone  MUSCULOSKELETAL: Normal range of motion. No tenderness.  No cyanosis, clubbing, or edema.  2+ distal pulses. LYMPHATIC: No Axillary, Supraclavicular, or Inguinal Adenopathy.    Labs:    Assessment:   Annual gynecologic examination 61 y.o. Contraception: post menopausal status Obesity (Class III) H/o breast cancer Vaginal atrophy.   Plan:  Pap: Pap Co Test ordered (patient's preference, although discussed that routine cervical screening can be q 3-5 years).  Mammogram: Not Indicated.  Patient is s/p double mastectomy for h/o breast cancer with bilateral implants and reconstruction.  However notes that her implants are approaching 10 years, and needs to have them removed/replaced.  Will refer to  General Surgery. Also will refer to Oncology to maintain yearly follow up if needed. Stool Guaiac Testing:  Not Indicated.  Will need repeat sigmoidoscopy.  Labs: None ordered.  Patient has plans to be seen by PCP in the next several weeks. will have them performed at that time.  Routine preventative health maintenance measures emphasized: Exercise/Diet/Weight control, Alcohol/Substance use risks and Stress Management Vaginal atrophy, not bothersome to patient.  No need for intervention at this time.  Return to [place], MD  [facility]

## 2016-05-29 LAB — PAP IG AND HPV HIGH-RISK
HPV, high-risk: NEGATIVE
PAP SMEAR COMMENT: 0

## 2016-05-31 NOTE — Progress Notes (Deleted)
 Hematology/Oncology Consult note [facility] [phone] [phone]  Patient Care Team: Javed Masoud, MD as PCP - General (Cardiology) Javed Masoud, MD as Referring Physician (Cardiology) Seeplaputhur G Sankar, MD (General Surgery)   Name of the patient: Tammy Webb  [id number]  05/28/1955    Reason for referral- h/o bilateral breast cancer   Referring physician- Dr. Anika Cherry   Date of visit: 05/31/16   History of presenting illness- Patient is a 61-year-old female with a prior history of 2 cm left breast cancer that was ER/PR positive and HER-2/neu negative. She had a left modified radical mastectomy, right mastectomy and bilateral implants. She also received adjuvant chemotherapy and 5 years of hormone therapy. Patient was seen by Dr. Sankar in February 2016 for concerns of nodularity in the right breast area. At that time there was no defined mass or lump noted and patient was reassured.  ECOG PS- ***  Pain scale- ***   Review of systems- Review of Systems  Constitutional: Negative for chills, fever, malaise/fatigue and weight loss.  HENT: Negative for congestion, ear discharge and nosebleeds.   Eyes: Negative for blurred vision.  Respiratory: Negative for cough, hemoptysis, sputum production, shortness of breath and wheezing.   Cardiovascular: Negative for chest pain, palpitations, orthopnea and claudication.  Gastrointestinal: Negative for abdominal pain, blood in stool, constipation, diarrhea, heartburn, melena, nausea and vomiting.  Genitourinary: Negative for dysuria, flank pain, frequency, hematuria and urgency.  Musculoskeletal: Negative for back pain, joint pain and myalgias.  Skin: Negative for rash.  Neurological: Negative for dizziness, tingling, focal weakness, seizures, weakness and headaches.  Endo/Heme/Allergies: Does not bruise/bleed easily.  Psychiatric/Behavioral: Negative for depression and suicidal ideas.  The patient does not have insomnia.     Allergies  Allergen Reactions  . Triamcinolone Acetonide Hives  . Morphine And Related Rash    There are no active problems to display for this patient.    Past Medical History:  Diagnosis Date  . Cancer ([facility]) 07/2005   Left Breast   . Sleep apnea      Past Surgical History:  Procedure Laterality Date  . BREAST SURGERY Bilateral 12/2005   Mastectomies with reconstruction  . CESAREAN SECTION  1993  . TONSILLECTOMY  1980    Social History   Social History  . Marital status: Divorced    Spouse name: N/A  . Number of children: N/A  . Years of education: N/A   Occupational History  . Not on file.   Social History Main Topics  . Smoking status: Never Smoker  . Smokeless tobacco: Never Used  . Alcohol use No  . Drug use: No  . Sexual activity: Not Currently    Birth control/ protection: Post-menopausal   Other Topics Concern  . Not on file   Social History Narrative  . No narrative on file     Family History  Problem Relation Age of Onset  . Breast cancer Maternal Aunt   . Ovarian cancer Paternal Aunt      Current Outpatient Prescriptions:  .  ADVAIR DISKUS 250-50 MCG/DOSE AEPB, , Disp: , Rfl:  .  EPIPEN 2-PAK 0.3 MG/0.3ML SOAJ injection, , Disp: , Rfl:  .  ferrous sulfate 325 (65 FE) MG tablet, Take by mouth., Disp: , Rfl:  .  fluticasone (FLONASE) 50 MCG/ACT nasal spray, Place into the nose., Disp: , Rfl:  .  ibuprofen (ADVIL,MOTRIN) 200 MG tablet, Take 200 mg by mouth every 6 (six) hours as needed., Disp: ,    Hematology/Oncology Consult note [facility] [phone] [phone]  Patient Care Team: Javed Masoud, MD as PCP - General (Cardiology) Javed Masoud, MD as Referring Physician (Cardiology) Seeplaputhur G Sankar, MD (General Surgery)   Name of the patient: Tammy Webb  [id number]  05/28/1955    Reason for referral- h/o bilateral breast cancer   Referring physician- Dr. Anika Cherry   Date of visit: 05/31/16   History of presenting illness- Patient is a 61-year-old female with a prior history of 2 cm left breast cancer that was ER/PR positive and HER-2/neu negative. She had a left modified radical mastectomy, right mastectomy and bilateral implants. She also received adjuvant chemotherapy and 5 years of hormone therapy. Patient was seen by Dr. Sankar in February 2016 for concerns of nodularity in the right breast area. At that time there was no defined mass or lump noted and patient was reassured.  ECOG PS- ***  Pain scale- ***   Review of systems- Review of Systems  Constitutional: Negative for chills, fever, malaise/fatigue and weight loss.  HENT: Negative for congestion, ear discharge and nosebleeds.   Eyes: Negative for blurred vision.  Respiratory: Negative for cough, hemoptysis, sputum production, shortness of breath and wheezing.   Cardiovascular: Negative for chest pain, palpitations, orthopnea and claudication.  Gastrointestinal: Negative for abdominal pain, blood in stool, constipation, diarrhea, heartburn, melena, nausea and vomiting.  Genitourinary: Negative for dysuria, flank pain, frequency, hematuria and urgency.  Musculoskeletal: Negative for back pain, joint pain and myalgias.  Skin: Negative for rash.  Neurological: Negative for dizziness, tingling, focal weakness, seizures, weakness and headaches.  Endo/Heme/Allergies: Does not bruise/bleed easily.  Psychiatric/Behavioral: Negative for depression and suicidal ideas.  The patient does not have insomnia.     Allergies  Allergen Reactions  . Triamcinolone Acetonide Hives  . Morphine And Related Rash    There are no active problems to display for this patient.    Past Medical History:  Diagnosis Date  . Cancer ([facility]) 07/2005   Left Breast   . Sleep apnea      Past Surgical History:  Procedure Laterality Date  . BREAST SURGERY Bilateral 12/2005   Mastectomies with reconstruction  . CESAREAN SECTION  1993  . TONSILLECTOMY  1980    Social History   Social History  . Marital status: Divorced    Spouse name: N/A  . Number of children: N/A  . Years of education: N/A   Occupational History  . Not on file.   Social History Main Topics  . Smoking status: Never Smoker  . Smokeless tobacco: Never Used  . Alcohol use No  . Drug use: No  . Sexual activity: Not Currently    Birth control/ protection: Post-menopausal   Other Topics Concern  . Not on file   Social History Narrative  . No narrative on file     Family History  Problem Relation Age of Onset  . Breast cancer Maternal Aunt   . Ovarian cancer Paternal Aunt      Current Outpatient Prescriptions:  .  ADVAIR DISKUS 250-50 MCG/DOSE AEPB, , Disp: , Rfl:  .  EPIPEN 2-PAK 0.3 MG/0.3ML SOAJ injection, , Disp: , Rfl:  .  ferrous sulfate 325 (65 FE) MG tablet, Take by mouth., Disp: , Rfl:  .  fluticasone (FLONASE) 50 MCG/ACT nasal spray, Place into the nose., Disp: , Rfl:  .  ibuprofen (ADVIL,MOTRIN) 200 MG tablet, Take 200 mg by mouth every 6 (six) hours as needed., Disp: ,

## 2016-06-01 ENCOUNTER — Encounter: Payer: Self-pay | Admitting: *Deleted

## 2016-06-01 ENCOUNTER — Telehealth: Payer: Self-pay

## 2016-06-01 NOTE — Telephone Encounter (Signed)
Called pt informed her of information below.

## 2016-06-01 NOTE — Telephone Encounter (Signed)
-----   Message from Rubie Maid, MD sent at 06/01/2016  2:03 PM EST ----- Please inform patient of normal pap smear.  Can continue with routine screening.

## 2016-06-02 ENCOUNTER — Encounter: Payer: Self-pay | Admitting: General Surgery

## 2016-06-02 ENCOUNTER — Inpatient Hospital Stay: Payer: 59 | Attending: Oncology | Admitting: Oncology

## 2016-06-02 ENCOUNTER — Ambulatory Visit (INDEPENDENT_AMBULATORY_CARE_PROVIDER_SITE_OTHER): Payer: 59 | Admitting: General Surgery

## 2016-06-02 VITALS — BP 132/74 | HR 61 | Resp 12 | Ht 62.0 in | Wt 230.0 lb

## 2016-06-02 DIAGNOSIS — Z853 Personal history of malignant neoplasm of breast: Secondary | ICD-10-CM

## 2016-06-02 NOTE — Progress Notes (Signed)
Patient ID: Tammy Webb, female   DOB: 12-22-1955, 61 y.o.   MRN: [id number]  Chief Complaint  Patient presents with  . Breast Cancer    HPI Tammy Webb is a 61 y.o. female here today for her follow up breast cancer and breast check. She has a history of left breast cancer treated with chemotherapy, bilateral mastectomies, and subsequent reconstruction. Also had 5 years of anti-hormonal therapy. Surgery was done in 2007. She was last seen 2 years ago with a questionable knot on the right breast area, exam was unremarkable at that time. Patient denies any new developments. I have reviewed the history of present illness with the patient.  HPI  Past Medical History:  Diagnosis Date  . Cancer ([place]) 07/2005   Left Breast   . Sleep apnea     Past Surgical History:  Procedure Laterality Date  . BREAST SURGERY Bilateral 12/2005   Mastectomies with reconstruction  . CESAREAN SECTION  1993  . COLONOSCOPY  2015  . TONSILLECTOMY  1980    Family History  Problem Relation Age of Onset  . Breast cancer Maternal Aunt   . Ovarian cancer Paternal Aunt     Social History Social History  Substance Use Topics  . Smoking status: Never Smoker  . Smokeless tobacco: Never Used  . Alcohol use No    Allergies  Allergen Reactions  . Triamcinolone Acetonide Hives  . Morphine And Related Rash    Current Outpatient Prescriptions  Medication Sig Dispense Refill  . ADVAIR DISKUS 250-50 MCG/DOSE AEPB     . EPIPEN 2-PAK 0.3 MG/0.3ML SOAJ injection     . ferrous sulfate 325 (65 FE) MG tablet Take by mouth.    . fluticasone (FLONASE) 50 MCG/ACT nasal spray Place into the nose.    . ibuprofen (ADVIL,MOTRIN) 200 MG tablet Take 200 mg by mouth every 6 (six) hours as needed.    Jerrye Beavers Polysacch (ALCORTIN A) 1-2-1 % GEL Apply ON THE SKIN as directed  3  . losartan (COZAAR) 100 MG tablet     . MAGNESIUM PO Take by mouth.    . Multiple Vitamins-Minerals (MULTIVITAMIN WITH MINERALS)  tablet Take 1 tablet by mouth daily.    . phentermine (ADIPEX-P) 37.5 MG tablet Take 37.5 mg by mouth daily.  1  . SUPER B COMPLEX & C TABS Take by mouth.     No current facility-administered medications for this visit.     Review of Systems Review of Systems  Constitutional: Negative.   Respiratory: Negative.   Cardiovascular: Negative.     Blood pressure 132/74, pulse 61, resp. rate 12, height 5\' 2"  (1.575 m), weight 230 lb (104.3 kg).  Physical Exam Physical Exam  Constitutional: She is oriented to person, place, and time. She appears well-developed and well-nourished.  Eyes: Conjunctivae are normal. No scleral icterus.  Neck: Neck supple.  Cardiovascular: Normal rate, regular rhythm and normal heart sounds.   Pulmonary/Chest: Effort normal and breath sounds normal. Right breast exhibits no inverted nipple, no mass, no nipple discharge, no skin change and no tenderness. Left breast exhibits no inverted nipple, no mass, no nipple discharge, no skin change and no tenderness.  Bilateral healed incisions with underlying implants which appear to be somewhat displaced laterally. Some minimal nodularity noted in the chest wall associated with her previous surgery. No defined mass or lump. Mild protrusion of the lower half of sternum suggestive of mild pectus carinum.   Abdominal: Soft. Normal appearance and bowel sounds  are normal. There is no hepatomegaly. There is no tenderness. No hernia.  Lymphadenopathy:    She has no cervical adenopathy.    She has no axillary adenopathy.  Neurological: She is alert and oriented to person, place, and time.  Skin: Skin is warm and dry.    Data Reviewed Prior notes reviewed   Assessment    History of left breast cancer. 10 years out. No evidence of recurrent disease on clinical exam. Will check CA 27-29.    Plan   CA 27-29 order.  Patient to return as needed. Encouraged that she be evaluated with an exam and CA 27-29 on a yearly basis. No  need for any imaging at this time.     This information has been scribed by Gaspar Cola CMA.    SANKAR,SEEPLAPUTHUR G 06/02/2016, 2:57 PM

## 2016-06-02 NOTE — Patient Instructions (Signed)
Return as needed

## 2016-06-03 LAB — CANCER ANTIGEN 27.29: CA 27.29: 17.3 U/mL (ref 0.0–38.6)

## 2016-06-06 ENCOUNTER — Telehealth: Payer: Self-pay | Admitting: Obstetrics and Gynecology

## 2016-06-06 ENCOUNTER — Telehealth: Payer: Self-pay | Admitting: *Deleted

## 2016-06-06 NOTE — Telephone Encounter (Signed)
Called Sherry at [facility], she states that they scheduled this pt for a visit however the pt did not show up, Judeen Hammans also mentions that she is not sure they actually need to see her as the pt's cancer was over 83yrs ago and they typically turn pt's back over to PCPs at that point. Also of note pt was seen by Dr. Jamal Collin on 06/02/16 who treated her in the past for her breast cancer. Per Dr.Sankar pt is just to f/u as needed. Angelica Chessman that if pt no showed and was recently seen by Dr.Sankar there is no need to persue an appointment further.

## 2016-06-06 NOTE — Telephone Encounter (Signed)
Called Dr. Marcelline Mates office and left message that pt did not come to new pt visit. I have also reviewed chart and found that Dr.  Jamal Collin had seen pt last week and did not feel anything in the breast. He told her that she can come back PRN.  I wanted to check to see if pt needs to be r/s because her breast cancer is over 11 years out and as oncology she would have been turned back over to PCP unless she had issues or progression and the staff called back later today and states that since she saw Jamal Collin she does not need to come.  I will not r/s her at this time. Unless the md office calls back

## 2016-06-06 NOTE — Telephone Encounter (Signed)
Tammy Webb from [facility] called saying Dr. Marcelline Mates had referred pt to them and pt missed her appt.  Pt is a former ca pt but nothing since 2007.  They want to know if this was a referral for something new.  You can call Tammy Webb back at either [phone] or [phone]  Thank sTeri

## 2016-06-07 ENCOUNTER — Telehealth: Payer: Self-pay | Admitting: *Deleted

## 2016-06-07 NOTE — Telephone Encounter (Signed)
-----   Message from Christene Lye, MD sent at 06/03/2016 10:05 AM EST ----- Normal CA 27-29

## 2016-06-15 NOTE — Telephone Encounter (Signed)
Unable to reach pt via phone, mailed letter. Labs were WNL.

## 2016-06-17 ENCOUNTER — Telehealth: Payer: Self-pay | Admitting: General Surgery

## 2016-06-17 NOTE — Telephone Encounter (Signed)
PATIENT WAS RETURNING MARSHA'S CALL.PT CAN BE REACHED @ [phone] OR SHE CAN L/M ON HOME MACHINE./MTH

## 2016-06-21 NOTE — Telephone Encounter (Signed)
Mailed letter, message left on home number. Normal labs.

## 2016-06-24 DIAGNOSIS — J301 Allergic rhinitis due to pollen: Secondary | ICD-10-CM | POA: Diagnosis not present

## 2016-07-07 DIAGNOSIS — J301 Allergic rhinitis due to pollen: Secondary | ICD-10-CM | POA: Diagnosis not present

## 2016-08-02 DIAGNOSIS — G4733 Obstructive sleep apnea (adult) (pediatric): Secondary | ICD-10-CM | POA: Diagnosis not present

## 2016-08-19 NOTE — Telephone Encounter (Signed)
Patient does not need SBE for stent.

## 2016-08-19 NOTE — Telephone Encounter (Signed)
Patient called and said she was going to have a crown put on her tooth today and wanted to know if she needed SBE antibiotics since she has a stent.  Talked with nurse and was told patient did not need antibiotic prior for a stent, only for a valve replacement and notified patient.

## 2016-09-01 NOTE — Telephone Encounter (Signed)
Patient called and needs refill of Atorvastatin 40 mg to [facility] [place].

## 2016-09-04 MED ORDER — ATORVASTATIN CALCIUM 40 MG PO TABS
40 | ORAL_TABLET | ORAL | 1 refills | Status: DC
Start: 2016-09-04 — End: 2017-04-14

## 2016-09-04 NOTE — Telephone Encounter (Signed)
[facility] [place].

## 2016-09-04 NOTE — Telephone Encounter (Signed)
Name of caller requesting page:[name]    Phone Number of caller: [phone]    Facility requesting page: NA    Reason for Page: Needs medication refill as she's leaving on vacation.  She's at the pharmacy.  The medication was supposed to be there.  Atorvasatatin.      Provider paged: Angelena FormMatt Genet, CNP    Practice Name of paged provider[facility]    Page Placed to #[place]: Smartweb- [phone]    Time Page was sent or provider contacted: 12:22 PM    Method of Contact: Smartweb- [phone]    Smart Web Page Content: Pt Sally FentonK. Duval 11.09.1957 Needs refill, she's leaving on vacation, she's at the pharmacy, the meds were supposed to be there, Atorvasatatin [phone]

## 2016-09-05 MED ORDER — ATORVASTATIN CALCIUM 40 MG PO TABS
40 | ORAL_TABLET | ORAL | 1 refills | Status: DC
Start: 2016-09-05 — End: 2017-07-31

## 2016-09-12 DIAGNOSIS — L309 Dermatitis, unspecified: Secondary | ICD-10-CM | POA: Diagnosis not present

## 2016-09-12 DIAGNOSIS — L718 Other rosacea: Secondary | ICD-10-CM | POA: Diagnosis not present

## 2016-09-12 DIAGNOSIS — L304 Erythema intertrigo: Secondary | ICD-10-CM | POA: Diagnosis not present

## 2016-09-23 DIAGNOSIS — J301 Allergic rhinitis due to pollen: Secondary | ICD-10-CM | POA: Diagnosis not present

## 2016-09-29 DIAGNOSIS — J301 Allergic rhinitis due to pollen: Secondary | ICD-10-CM | POA: Diagnosis not present

## 2016-10-31 DIAGNOSIS — L309 Dermatitis, unspecified: Secondary | ICD-10-CM | POA: Diagnosis not present

## 2016-11-07 NOTE — Telephone Encounter (Signed)
Labs were done 11;/'2017.

## 2016-11-08 MED ORDER — METOPROLOL SUCCINATE ER 25 MG PO TB24
25 MG | ORAL_TABLET | ORAL | 3 refills | Status: DC
Start: 2016-11-08 — End: 2017-11-23

## 2016-11-30 DIAGNOSIS — Z9010 Acquired absence of unspecified breast and nipple: Secondary | ICD-10-CM | POA: Diagnosis not present

## 2016-11-30 DIAGNOSIS — L821 Other seborrheic keratosis: Secondary | ICD-10-CM | POA: Diagnosis not present

## 2016-11-30 DIAGNOSIS — I10 Essential (primary) hypertension: Secondary | ICD-10-CM | POA: Diagnosis not present

## 2016-12-30 DIAGNOSIS — J301 Allergic rhinitis due to pollen: Secondary | ICD-10-CM | POA: Diagnosis not present

## 2017-01-02 DIAGNOSIS — J301 Allergic rhinitis due to pollen: Secondary | ICD-10-CM | POA: Diagnosis not present

## 2017-01-09 DIAGNOSIS — H60543 Acute eczematoid otitis externa, bilateral: Secondary | ICD-10-CM | POA: Diagnosis not present

## 2017-01-09 DIAGNOSIS — J309 Allergic rhinitis, unspecified: Secondary | ICD-10-CM | POA: Diagnosis not present

## 2017-01-31 DIAGNOSIS — G4733 Obstructive sleep apnea (adult) (pediatric): Secondary | ICD-10-CM | POA: Diagnosis not present

## 2017-02-22 ENCOUNTER — Encounter

## 2017-02-22 NOTE — Telephone Encounter (Signed)
Patient called and has an appt with Dr. Milinda AntisIler in January and wanted to know if she needs any labs done prior and if so mail the order to her.

## 2017-02-22 NOTE — Telephone Encounter (Signed)
Orders placed in epic. Will mail when signed.

## 2017-02-27 NOTE — Telephone Encounter (Signed)
Mailed lab orders to patient.

## 2017-03-17 ENCOUNTER — Inpatient Hospital Stay: Attending: Cardiovascular Disease | Primary: Family Medicine

## 2017-03-17 DIAGNOSIS — I2510 Atherosclerotic heart disease of native coronary artery without angina pectoris: Secondary | ICD-10-CM

## 2017-03-17 LAB — CBC
Hematocrit: 42.9 % (ref 35.0–47.0)
Hemoglobin: 14.2 g/dL (ref 11.7–16.0)
MCH: 27.5 pg (ref 26.0–34.0)
MCHC: 33.2 % (ref 32.0–36.0)
MCV: 82.9 fL (ref 79.0–98.0)
MPV: 7.7 fL (ref 7.4–10.4)
Platelets: 223 10*3/uL (ref 140–440)
RBC: 5.17 10*6/uL (ref 3.80–5.20)
RDW: 12.7 % (ref 11.5–14.5)
WBC: 5.8 10*3/uL (ref 3.6–10.7)

## 2017-03-17 LAB — COMPREHENSIVE METABOLIC PANEL
ALT: 34 U/L (ref 13–69)
AST: 25 U/L (ref 15–46)
Albumin,Serum: 4.5 g/dL (ref 3.5–5.0)
Alkaline Phosphatase: 64 U/L (ref 38–126)
Anion Gap: 7 NA
BUN: 17 mg/dL (ref 7–20)
CO2: 28 mmol/L (ref 22–30)
Calcium: 10.2 mg/dL (ref 8.4–10.4)
Chloride: 105 mmol/L (ref 98–107)
Creatinine: 0.8 mg/dL (ref 0.52–1.25)
EGFR IF NonAfrican American: >60 mL/min (ref >60)
Glucose: 99 mg/dL (ref 70–100)
Potassium: 4.5 mmol/L (ref 3.5–5.1)
Sodium: 140 mmol/L (ref 135–145)
Total Bilirubin: 0.8 mg/dL (ref 0.2–1.3)
Total Protein: 7.2 g/dL (ref 6.3–8.2)
eGFR African American: >60 mL/min (ref >60)

## 2017-03-17 LAB — LIPID PANEL
Chol/HDL Ratio: 2 NA
Cholesterol: 123 mg/dL (ref <200)
HDL: 77 mg/dL — ABNORMAL HIGH (ref 40–60)
LDL Cholesterol: 34 mg/dL (ref <100)
Triglycerides: 58 mg/dL (ref <150)

## 2017-03-17 LAB — CK: Total CK: 89 U/L (ref 30–170)

## 2017-03-17 NOTE — Other (Unsigned)
Patient Acct Nbr: [id number]   Primary AUTH/CERT:   Primary Insurance Company Name: [employer] [facility]  Primary Insurance Plan name: [facility] Opt [id number]  Primary Insurance Group Number: [id number]  Primary Insurance Plan Type: Health  Primary Insurance Policy Number: [id number]

## 2017-03-21 NOTE — Telephone Encounter (Signed)
Called patient and told her that her CMP was normal and the lipids are stable with HDL 77 and LDL 34.

## 2017-03-21 NOTE — Telephone Encounter (Signed)
Patient called and left message on voice mail returning your call.

## 2017-03-24 DIAGNOSIS — J301 Allergic rhinitis due to pollen: Secondary | ICD-10-CM | POA: Diagnosis not present

## 2017-03-27 DIAGNOSIS — J301 Allergic rhinitis due to pollen: Secondary | ICD-10-CM | POA: Diagnosis not present

## 2017-04-10 ENCOUNTER — Encounter: Attending: Cardiovascular Disease | Primary: Family Medicine

## 2017-04-14 ENCOUNTER — Ambulatory Visit
Admit: 2017-04-14 | Discharge: 2017-04-14 | Payer: PRIVATE HEALTH INSURANCE | Attending: Cardiovascular Disease | Primary: Family Medicine

## 2017-04-14 ENCOUNTER — Other Ambulatory Visit: Payer: Self-pay

## 2017-04-14 ENCOUNTER — Telehealth: Payer: Self-pay

## 2017-04-14 DIAGNOSIS — Z1211 Encounter for screening for malignant neoplasm of colon: Secondary | ICD-10-CM

## 2017-04-14 DIAGNOSIS — I2510 Atherosclerotic heart disease of native coronary artery without angina pectoris: Secondary | ICD-10-CM

## 2017-04-14 NOTE — Progress Notes (Signed)
[facility] Office Note    DATE of SERVICE: 04/14/17    TIME of SERVICE: 9:49 AM                   Reason for Visit:  Chief Complaint   Patient presents with   . 1 Year Follow Up        History of Present Illness:   Sally Wong is a 62 y.o. female who returns for follow-up of coronary artery disease status post DES to the left anterior descending in 2010.  She is doing well.  She denies chest discomfort or shortness of breath.  She has a rare palpitation, probably more related to caffeine intake.    12-lead EKG, which I personally reviewed today, shows sinus rhythm without concerning changes.    Past Medical History:  Past Medical History:   Diagnosis Date   . Atherosclerotic heart disease of native coronary artery without angina pectoris    . Chest pain, unspecified    . Essential (primary) hypertension    . Family history of ischemic heart disease and other diseases of the circulatory system    . GERD (gastroesophageal reflux disease)    . Hyperlipidemia    . Presence of coronary angioplasty implant and graft         Past Surgical History  Past Surgical History:   Procedure Laterality Date   . CORONARY ANGIOPLASTY WITH STENT PLACEMENT  01/2009    PCI DES LAD   . HYSTERECTOMY, TOTAL ABDOMINAL         Family History  Family History   Problem Relation Age of Onset   . Heart Attack Father         myocardial infarction at less than 5460        Social History  Social History   Substance Use Topics   . Smoking status: Never Smoker   . Smokeless tobacco: Never Used   . Alcohol use No        Medications:    Current Outpatient Prescriptions:   .  metoprolol succinate (TOPROL XL) 25 MG extended release tablet, TAKE 1 TABLET BY MOUTH EVERY DAY, Disp: 90 tablet, Rfl: 3  .  atorvastatin (LIPITOR) 40 MG tablet, Take 1 tablet by mouth every other day, Disp: 45 tablet, Rfl: 1  .  aspirin 81 MG EC tablet, Take 81 mg by mouth daily Take As Directed, Disp: , Rfl:   .  sodium chloride (SALINE MIST) 0.65 % nasal spray, 1  spray by Nasal route as needed for Congestion Take As Directed, Disp: , Rfl:   .  nitroGLYCERIN (NITROSTAT) 0.4 MG SL tablet, Place 1 tablet under the tongue every 5 minutes as needed for Chest pain Dissolve 1 tab under tongue at first sign of chest pain. May repeat every 5 minutes until relief is obtained. If pain persists after taking 3 tabs in a 15-minute period,  call 9-1-1 immediately., Disp: 25 tablet, Rfl: 3    Review of Systems:  Review of Systems   Constitutional: Negative for diaphoresis, fatigue and fever.   HENT: Negative for congestion and nosebleeds.    Eyes: Negative for visual disturbance.   Respiratory: Negative for cough, shortness of breath and wheezing.    Cardiovascular: Positive for palpitations. Negative for chest pain and leg swelling.   Gastrointestinal: Negative for abdominal pain, blood in stool, nausea and vomiting.   Genitourinary: Negative for hematuria.   Musculoskeletal: Positive for myalgias. Negative for arthralgias.  Skin: Negative for rash.   Neurological: Negative for dizziness and syncope.   Hematological: Does not bruise/bleed easily.   Psychiatric/Behavioral: Negative for dysphoric mood.        Denies Depression       Physical Examination:  Vitals: Blood pressure 108/80, pulse 78, resp. rate 16, height 5' (1.524 m), weight 172 lb (78 kg). Body mass index is 33.59 kg/m.  Physical Exam   Constitutional: She is oriented to person, place, and time. She appears well-developed and well-nourished.   HENT:   Head: Normocephalic.   Eyes: Conjunctivae are normal. No scleral icterus.   Neck: No JVD present.   Cardiovascular: Regular rhythm, S1 normal, S2 normal and intact distal pulses.  PMI is not displaced.  Exam reveals no S3 and no S4.    No murmur heard.  Pulmonary/Chest: Breath sounds normal. She has no wheezes. She has no rales.   Abdominal: Soft. Bowel sounds are normal. There is no tenderness.   Musculoskeletal: She exhibits no edema.   Neurological: She is alert and oriented  to person, place, and time.   Skin: Skin is warm and dry. No rash noted.   Psychiatric: She has a normal mood and affect.       Laboratory Tests:   Lab Results   Component Value Date    NA 140 03/17/2017    K 4.5 03/17/2017    CL 105 03/17/2017    CO2 28 03/17/2017    BUN 17 03/17/2017    CREATININE 0.80 03/17/2017    GLUCOSE 99 03/17/2017    CALCIUM 10.2 03/17/2017      Lab Results   Component Value Date    NA 140 03/17/2017    K 4.5 03/17/2017    CL 105 03/17/2017    CO2 28 03/17/2017    BUN 17 03/17/2017    CREATININE 0.80 03/17/2017    GLUCOSE 99 03/17/2017    CALCIUM 10.2 03/17/2017    PROT 7.2 03/17/2017    LABALBU 4.5 03/17/2017    BILITOT 0.8 03/17/2017    ALKPHOS 64 03/17/2017    AST 25 03/17/2017    ALT 34 03/17/2017    LABGLOM >60 02/19/2016    LABGLOM >60 02/19/2016     Lab Results   Component Value Date    CHOL 123 03/17/2017    CHOL 107 02/19/2016    CHOL 107 02/19/2016     Lab Results   Component Value Date    TRIG 58 03/17/2017    TRIG 77 02/19/2016    TRIG 77 02/19/2016     Lab Results   Component Value Date    HDL 77 (H) 03/17/2017    HDL 74 (A) 02/19/2016    HDL 74 (H) 02/19/2016     Lab Results   Component Value Date    LDLCALC 18 02/19/2016    LDLCALC 18 02/19/2016    LDLCALC 19 08/11/2015    LDLCHOLESTEROL 34 03/17/2017       Cardiac Tests:    Echo (date: 12/14/2010):  Normal stress echo    Cardiac Catheterization (date:  01/19/2009): Single-vessel disease, successful DES to the mid left anterior descending          Assessment and Plan:  1.  Coronary artery disease.  She is status post left anterior descending stenting in 2010.  She has no angina pectoris.  Continue aggressive secondary prevention measures.  She developed myalgias on daily atorvastatin.  These improved by taking the medicine every other day.  Her numbers are excellent as above.         Skarleth Delmonico A. Milinda Antis, MD, [employer]

## 2017-04-14 NOTE — Telephone Encounter (Signed)
Gastroenterology Pre-Procedure Review  Request Date: 05/02/17 Requesting Physician: Dr. Allen Norris  PATIENT REVIEW QUESTIONS: The patient responded to the following health history questions as indicated:    1. Are you having any GI issues? no 2. Do you have a personal history of Polyps? yes (thinks so but doesnt recall year) 3. Do you have a family history of Colon Cancer or Polyps? no 4. Diabetes Mellitus? no 5. Joint replacements in the past 12 months?no 6. Major health problems in the past 3 months?no 7. Any artificial heart valves, MVP, or defibrillator?no    MEDICATIONS & ALLERGIES:    Patient reports the following regarding taking any anticoagulation/antiplatelet therapy:   Plavix, Coumadin, Eliquis, Xarelto, Lovenox, Pradaxa, Brilinta, or Effient? no Aspirin? yes (81 mg)  Patient confirms/reports the following medications:  Current Outpatient Medications  Medication Sig Dispense Refill  . ADVAIR DISKUS 250-50 MCG/DOSE AEPB     . EPIPEN 2-PAK 0.3 MG/0.3ML SOAJ injection     . ferrous sulfate 325 (65 FE) MG tablet Take by mouth.    . fluticasone (FLONASE) 50 MCG/ACT nasal spray Place into the nose.    . ibuprofen (ADVIL,MOTRIN) 200 MG tablet Take 200 mg by mouth every 6 (six) hours as needed.    Jerrye Beavers Polysacch (ALCORTIN A) 1-2-1 % GEL Apply ON THE SKIN as directed  3  . losartan (COZAAR) 100 MG tablet     . MAGNESIUM PO Take by mouth.    . Multiple Vitamins-Minerals (MULTIVITAMIN WITH MINERALS) tablet Take 1 tablet by mouth daily.    . phentermine (ADIPEX-P) 37.5 MG tablet Take 37.5 mg by mouth daily.  1  . SUPER B COMPLEX & C TABS Take by mouth.     No current facility-administered medications for this visit.     Patient confirms/reports the following allergies:  Allergies  Allergen Reactions  . Triamcinolone Acetonide Hives  . Morphine And Related Rash    No orders of the defined types were placed in this encounter.   AUTHORIZATION INFORMATION Primary  Insurance: 1D#: Group #:  Secondary Insurance: 1D#: Group #:  SCHEDULE INFORMATION: Date: 05/02/17 Time: Location:[facility]

## 2017-04-17 DIAGNOSIS — M75102 Unspecified rotator cuff tear or rupture of left shoulder, not specified as traumatic: Secondary | ICD-10-CM | POA: Diagnosis not present

## 2017-04-17 DIAGNOSIS — Z9010 Acquired absence of unspecified breast and nipple: Secondary | ICD-10-CM | POA: Diagnosis not present

## 2017-04-17 DIAGNOSIS — I10 Essential (primary) hypertension: Secondary | ICD-10-CM | POA: Diagnosis not present

## 2017-04-28 ENCOUNTER — Telehealth: Payer: Self-pay | Admitting: *Deleted

## 2017-04-28 NOTE — Telephone Encounter (Signed)
Patient is scheduled for a colonoscopy and stated that she talked to you and wanted to go over the prep and information about the colonoscopy.

## 2017-05-01 ENCOUNTER — Other Ambulatory Visit: Payer: Self-pay

## 2017-05-01 ENCOUNTER — Telehealth: Payer: Self-pay

## 2017-05-01 MED ORDER — NA SULFATE-K SULFATE-MG SULF 17.5-3.13-1.6 GM/177ML PO SOLN
1.0000 | Freq: Once | ORAL | 0 refills | Status: AC
Start: 2017-05-01 — End: 2017-05-01

## 2017-05-01 NOTE — Progress Notes (Signed)
Patient stated that she did not receive her instructions for her colonoscopy.  I read her instructions for her clear liquid diet and provided her with instructions for her bowel prep using Su-Prep.  The rx for Suprep has been faxed to pharmacy for her.

## 2017-05-01 NOTE — Telephone Encounter (Signed)
Patient stated that she did not receive her instructions for her colonoscopy.  I read her instructions for her clear liquid diet and provided her with instructions for her bowel prep using Su-Prep.  The rx for Suprep has been faxed to pharmacy for her.

## 2017-05-02 ENCOUNTER — Encounter
Admission: RE | Disposition: A | Discharge status: Home / Self Care | Payer: Self-pay | Source: Ambulatory Visit | Attending: Gastroenterology

## 2017-05-02 ENCOUNTER — Ambulatory Visit
Admission: RE | Admit: 2017-05-02 | Discharge: 2017-05-02 | Disposition: A | Discharge status: Home / Self Care | Payer: 59 | Source: Ambulatory Visit | Attending: Gastroenterology | Admitting: Gastroenterology

## 2017-05-02 ENCOUNTER — Encounter: Payer: Self-pay | Admitting: *Deleted

## 2017-05-02 ENCOUNTER — Ambulatory Visit: Payer: 59 | Admitting: Anesthesiology

## 2017-05-02 DIAGNOSIS — D121 Benign neoplasm of appendix: Secondary | ICD-10-CM

## 2017-05-02 DIAGNOSIS — I10 Essential (primary) hypertension: Secondary | ICD-10-CM | POA: Insufficient documentation

## 2017-05-02 DIAGNOSIS — Z8041 Family history of malignant neoplasm of ovary: Secondary | ICD-10-CM | POA: Insufficient documentation

## 2017-05-02 DIAGNOSIS — Z7951 Long term (current) use of inhaled steroids: Secondary | ICD-10-CM | POA: Insufficient documentation

## 2017-05-02 DIAGNOSIS — Z885 Allergy status to narcotic agent status: Secondary | ICD-10-CM | POA: Diagnosis not present

## 2017-05-02 DIAGNOSIS — Z860100 Personal history of colon polyps, unspecified: Secondary | ICD-10-CM

## 2017-05-02 DIAGNOSIS — Z1211 Encounter for screening for malignant neoplasm of colon: Secondary | ICD-10-CM

## 2017-05-02 DIAGNOSIS — Z803 Family history of malignant neoplasm of breast: Secondary | ICD-10-CM | POA: Insufficient documentation

## 2017-05-02 DIAGNOSIS — Z9013 Acquired absence of bilateral breasts and nipples: Secondary | ICD-10-CM | POA: Diagnosis not present

## 2017-05-02 DIAGNOSIS — Z853 Personal history of malignant neoplasm of breast: Secondary | ICD-10-CM | POA: Insufficient documentation

## 2017-05-02 DIAGNOSIS — J449 Chronic obstructive pulmonary disease, unspecified: Secondary | ICD-10-CM | POA: Insufficient documentation

## 2017-05-02 DIAGNOSIS — Z888 Allergy status to other drugs, medicaments and biological substances status: Secondary | ICD-10-CM | POA: Insufficient documentation

## 2017-05-02 DIAGNOSIS — Z79899 Other long term (current) drug therapy: Secondary | ICD-10-CM | POA: Diagnosis not present

## 2017-05-02 DIAGNOSIS — Z9889 Other specified postprocedural states: Secondary | ICD-10-CM | POA: Insufficient documentation

## 2017-05-02 DIAGNOSIS — G4730 Sleep apnea, unspecified: Secondary | ICD-10-CM | POA: Diagnosis not present

## 2017-05-02 DIAGNOSIS — D120 Benign neoplasm of cecum: Secondary | ICD-10-CM | POA: Diagnosis not present

## 2017-05-02 DIAGNOSIS — D126 Benign neoplasm of colon, unspecified: Secondary | ICD-10-CM | POA: Diagnosis not present

## 2017-05-02 HISTORY — DX: Essential (primary) hypertension: I10

## 2017-05-02 HISTORY — PX: COLONOSCOPY WITH PROPOFOL: SHX5780

## 2017-05-02 SURGERY — COLONOSCOPY WITH PROPOFOL
Anesthesia: General

## 2017-05-02 MED ORDER — LIDOCAINE HCL (CARDIAC) 20 MG/ML IV SOLN
INTRAVENOUS | Status: DC | PRN
  Administered 2017-05-02: 50 mg via INTRAVENOUS

## 2017-05-02 MED ORDER — PROPOFOL 500 MG/50ML IV EMUL
INTRAVENOUS | Status: DC | PRN
  Administered 2017-05-02: 160 ug/kg/min via INTRAVENOUS

## 2017-05-02 MED ORDER — PROPOFOL 500 MG/50ML IV EMUL
INTRAVENOUS | Status: AC
  Filled 2017-05-02: qty 50

## 2017-05-02 MED ORDER — LIDOCAINE HCL (PF) 2 % IJ SOLN
INTRAMUSCULAR | Status: AC
  Filled 2017-05-02: qty 10

## 2017-05-02 MED ORDER — EPHEDRINE SULFATE 50 MG/ML IJ SOLN
INTRAMUSCULAR | Status: AC
  Filled 2017-05-02: qty 1

## 2017-05-02 MED ORDER — EPHEDRINE SULFATE 50 MG/ML IJ SOLN
INTRAMUSCULAR | Status: DC | PRN
  Administered 2017-05-02: 10 mg via INTRAVENOUS

## 2017-05-02 MED ORDER — PROPOFOL 10 MG/ML IV BOLUS
INTRAVENOUS | Status: DC | PRN
  Administered 2017-05-02: 60 mg via INTRAVENOUS

## 2017-05-02 MED ORDER — SODIUM CHLORIDE 0.9 % IV SOLN
INTRAVENOUS | Status: DC
  Administered 2017-05-02: 1000 mL via INTRAVENOUS

## 2017-05-02 NOTE — Anesthesia Preprocedure Evaluation (Addendum)
Anesthesia Evaluation  Patient identified by MRN, date of birth, ID band Patient awake    Reviewed: Allergy & Precautions, NPO status , Patient's Chart, lab work & pertinent test results  Airway Mallampati: III  TM Distance: <3 FB     Dental  (+) Caps   Pulmonary sleep apnea and Continuous Positive Airway Pressure Ventilation , COPD,  COPD inhaler,    Pulmonary exam normal        Cardiovascular hypertension, Pt. on medications Normal cardiovascular exam     Neuro/Psych negative neurological ROS  negative psych ROS   GI/Hepatic negative GI ROS, Neg liver ROS,   Endo/Other  negative endocrine ROS  Renal/GU negative Renal ROS  negative genitourinary   Musculoskeletal negative musculoskeletal ROS (+)   Abdominal Normal abdominal exam  (+)   Peds negative pediatric ROS (+)  Hematology negative hematology ROS (+)   Anesthesia Other Findings   Reproductive/Obstetrics                            Anesthesia Physical Anesthesia Plan  ASA: III  Anesthesia Plan: General   Post-op Pain Management:    Induction: Intravenous  PONV Risk Score and Plan:   Airway Management Planned: Nasal Cannula  Additional Equipment:   Intra-op Plan:   Post-operative Plan:   Informed Consent: I have reviewed the patients History and Physical, chart, labs and discussed the procedure including the risks, benefits and alternatives for the proposed anesthesia with the patient or authorized representative who has indicated his/her understanding and acceptance.     Plan Discussed with: CRNA and Surgeon  Anesthesia Plan Comments:         Anesthesia Quick Evaluation

## 2017-05-02 NOTE — Anesthesia Procedure Notes (Signed)
Date/Time: 05/02/2017 11:08 AM Performed by: Johnna Acosta, CRNA Pre-anesthesia Checklist: Patient identified, Emergency Drugs available, Suction available, Patient being monitored and Timeout performed Patient Re-evaluated:Patient Re-evaluated prior to induction Oxygen Delivery Method: Nasal cannula Preoxygenation: Pre-oxygenation with 100% oxygen

## 2017-05-02 NOTE — Anesthesia Postprocedure Evaluation (Signed)
Anesthesia Post Note  Patient: Tammy Webb  Procedure(s) Performed: COLONOSCOPY WITH PROPOFOL (N/A )  Patient location during evaluation: PACU Anesthesia Type: General Level of consciousness: awake and alert and oriented Pain management: pain level controlled Vital Signs Assessment: post-procedure vital signs reviewed and stable Respiratory status: spontaneous breathing Cardiovascular status: blood pressure returned to baseline Anesthetic complications: no     Last Vitals:  Vitals:   05/02/17 1155 05/02/17 1200  BP: 139/77   Pulse: 63 (!) 58  Resp: 18 18  Temp:    SpO2: 99% 98%    Last Pain:  Vitals:   05/02/17 1130  TempSrc: Tympanic                 Jaise Moser

## 2017-05-02 NOTE — Transfer of Care (Signed)
Immediate Anesthesia Transfer of Care Note  Patient: Tammy Webb  Procedure(s) Performed: COLONOSCOPY WITH PROPOFOL (N/A )  Patient Location: PACU  Anesthesia Type:General  Level of Consciousness: awake and alert   Airway & Oxygen Therapy: Patient Spontanous Breathing and Patient connected to nasal cannula oxygen  Post-op Assessment: Report given to RN and Post -op Vital signs reviewed and stable  Post vital signs: Reviewed and stable  Last Vitals:  Vitals:   05/02/17 0943 05/02/17 1130  BP: (!) 142/63 (!) 101/49  Pulse: 67 63  Resp: 20 20  Temp: 36.8 C (!) 36.1 C  SpO2: 97% 100%    Last Pain:  Vitals:   05/02/17 1130  TempSrc: Tympanic         Complications: No apparent anesthesia complications

## 2017-05-02 NOTE — Op Note (Signed)
[facility] Patient Name: Tammy Webb Procedure Date: 05/02/2017 11:06 AM MRN: [id number] Account #: [id number] Date of Birth: 09-08-1955 Admit Type: Outpatient Age: 62 Room: [facility] ENDO ROOM 4 Gender: Female Note Status: Finalized Procedure:            Colonoscopy Indications:          High risk colon cancer surveillance: Personal history                        of colonic polyps Providers:            Lucilla Lame MD, MD Referring MD:         Chesley Noon. Cherry (Referring MD) Medicines:            Propofol per Anesthesia Complications:        No immediate complications. Procedure:            Pre-Anesthesia Assessment:                       - Prior to the procedure, a History and Physical was                        performed, and patient medications and allergies were                        reviewed. The patient's tolerance of previous                        anesthesia was also reviewed. The risks and benefits of                        the procedure and the sedation options and risks were                        discussed with the patient. All questions were                        answered, and informed consent was obtained. Prior                        Anticoagulants: The patient has taken no previous                        anticoagulant or antiplatelet agents. ASA Grade                        Assessment: II - A patient with mild systemic disease.                        After reviewing the risks and benefits, the patient was                        deemed in satisfactory condition to undergo the                        procedure.                       After obtaining informed consent, the colonoscope was  passed under direct vision. Throughout the procedure,                        the patient's blood pressure, pulse, and oxygen                        saturations were monitored continuously. The                        Colonoscope was  introduced through the anus and                        advanced to the the cecum, identified by appendiceal                        orifice and ileocecal valve. The colonoscopy was                        performed without difficulty. The patient tolerated the                        procedure well. The quality of the bowel preparation                        was excellent. Findings:      The perianal and digital rectal examinations were normal.      A 8 mm polyp was found in the appendiceal orifice. The polyp was       sessile. Biopsies were taken with a cold forceps for histology. Impression:           - One 8 mm polyp at the appendiceal orifice. Biopsied. Recommendation:       - Discharge patient to home.                       - Resume previous diet.                       - Continue present medications.                       - Await pathology results.                       - If lesion at the appendix an adenoma then she should                        have her appendix taken out. Procedure Code(s):    --- Professional ---                       [phone], Colonoscopy, flexible; with biopsy, single or                        multiple Diagnosis Code(s):    --- Professional ---                       Z86.010, Personal history of colonic polyps                       D12.1, Benign neoplasm of appendix CPT copyright 2016 American Medical Association. All rights reserved. The codes documented in  this report are preliminary and upon coder review may  be revised to meet current compliance requirements. Lucilla Lame MD, MD 05/02/2017 11:30:58 AM This report has been signed electronically. Number of Addenda: 0 Note Initiated On: 05/02/2017 11:06 AM Scope Withdrawal Time: 0 hours 8 minutes 25 seconds  Total Procedure Duration: 0 hours 15 minutes 16 seconds       [facility]

## 2017-05-02 NOTE — H&P (Signed)
Lucilla Lame, MD [address] [place], [place] [address] Phone:[phone] Fax : [phone]  Primary Care Physician:  Rubie Maid, MD Primary Gastroenterologist:  Dr. Allen Norris  Pre-Procedure History & Physical: HPI:  Tammy Webb is a 62 y.o. female is here for an colonoscopy.   Past Medical History:  Diagnosis Date  . Cancer ([place]) 07/2005   Left Breast   . Hypertension   . Sleep apnea     Past Surgical History:  Procedure Laterality Date  . BREAST SURGERY Bilateral 12/2005   Mastectomies with reconstruction  . CESAREAN SECTION  1993  . COLONOSCOPY  2015  . TONSILLECTOMY  1980    Prior to Admission medications   Medication Sig Start Date End Date Taking? Authorizing Provider  ADVAIR DISKUS 250-50 MCG/DOSE AEPB  02/19/14   [provider]  EPIPEN 2-PAK 0.3 MG/0.3ML SOAJ injection  05/06/14   [provider]  fluticasone (FLONASE) 50 MCG/ACT nasal spray Place into the nose.    [provider]  ibuprofen (ADVIL,MOTRIN) 200 MG tablet Take 200 mg by mouth every 6 (six) hours as needed.    [provider]  Iodoquinol-HC-Aloe Polysacch (ALCORTIN A) 1-2-1 % GEL Apply ON THE SKIN as directed 04/27/16   [provider]  losartan (COZAAR) 100 MG tablet  04/29/14   [provider]  Multiple Vitamins-Minerals (MULTIVITAMIN WITH MINERALS) tablet Take 1 tablet by mouth daily.    [provider]  phentermine (ADIPEX-P) 37.5 MG tablet Take 37.5 mg by mouth daily. 02/22/16   [provider]  [place] TABS Take by mouth.    [provider]    Allergies as of 04/14/2017 - Review Complete 06/02/2016  Allergen Reaction Noted  . Triamcinolone acetonide Hives 04/28/2016  . Morphine and related Rash 05/12/2014    Family History  Problem Relation Age of Onset  . Breast cancer Maternal Aunt   . Ovarian cancer Paternal Aunt     Social History   Socioeconomic History  . Marital  status: Divorced    Spouse name: Not on file  . Number of children: Not on file  . Years of education: Not on file  . Highest education level: Not on file  Social Needs  . Financial resource strain: Not on file  . Food insecurity - worry: Not on file  . Food insecurity - inability: Not on file  . Transportation needs - medical: Not on file  . Transportation needs - non-medical: Not on file  Occupational History  . Not on file  Tobacco Use  . Smoking status: Never Smoker  . Smokeless tobacco: Never Used  Substance and Sexual Activity  . Alcohol use: No    Alcohol/week: 0.0 oz  . Drug use: No  . Sexual activity: Not Currently    Birth control/protection: Post-menopausal  Other Topics Concern  . Not on file  Social History Narrative  . Not on file    Review of Systems: See HPI, otherwise negative ROS  Physical Exam: BP (!) 142/63   Pulse 67   Temp 98.2 F (36.8 C) (Tympanic)   Resp 20   Ht 5\' 2"  (1.575 m)   Wt 214 lb (97.1 kg)   SpO2 97%   BMI 39.14 kg/m  General:   Alert,  pleasant and cooperative in NAD Head:  Normocephalic and atraumatic. Neck:  Supple; no masses or thyromegaly. Lungs:  Clear throughout to auscultation.    Heart:  Regular rate and rhythm.  Abdomen:  Soft, nontender and nondistended. Normal bowel sounds, without guarding, and without rebound.   Neurologic:  Alert and  oriented x4;  grossly normal neurologically.  Impression/Plan: Tammy Webb is here for an colonoscopy to be performed for history of colon polyps  Risks, benefits, limitations, and alternatives regarding  colonoscopy have been reviewed with the patient.  Questions have been answered.  All parties agreeable.   Lucilla Lame, MD  05/02/2017, 11:00 AM

## 2017-05-02 NOTE — Anesthesia Post-op Follow-up Note (Signed)
Anesthesia QCDR form completed.        

## 2017-05-03 ENCOUNTER — Encounter: Payer: Self-pay | Admitting: Gastroenterology

## 2017-05-03 LAB — SURGICAL PATHOLOGY

## 2017-05-05 ENCOUNTER — Telehealth: Payer: Self-pay

## 2017-05-05 ENCOUNTER — Other Ambulatory Visit: Payer: Self-pay

## 2017-05-05 DIAGNOSIS — D121 Benign neoplasm of appendix: Secondary | ICD-10-CM

## 2017-05-05 NOTE — Telephone Encounter (Signed)
Pt notified of colonoscopy results. Referral has been submitted to [facility] for consultation.

## 2017-05-05 NOTE — Telephone Encounter (Signed)
-----   Message from Lucilla Lame, MD sent at 05/04/2017 10:04 AM EST ----- But the patient know that the polyp and the appendiceal orifice was an adenoma and she needs to have her appendix removed. The patient should be referred to surgery.

## 2017-05-09 ENCOUNTER — Encounter: Payer: Self-pay | Admitting: General Surgery

## 2017-05-09 ENCOUNTER — Ambulatory Visit: Payer: 59 | Admitting: General Surgery

## 2017-05-09 ENCOUNTER — Telehealth: Payer: Self-pay | Admitting: General Surgery

## 2017-05-09 VITALS — BP 136/70 | HR 74 | Resp 16 | Ht 62.0 in | Wt 222.0 lb

## 2017-05-09 DIAGNOSIS — D121 Benign neoplasm of appendix: Secondary | ICD-10-CM | POA: Diagnosis not present

## 2017-05-09 NOTE — Telephone Encounter (Signed)
I've l/m's on h&c#'s letting her know her appointment has been changed to 1:30,because Dr Bary Castilla has a surgery at lunch.I've ask her to call & confirm.

## 2017-05-09 NOTE — Patient Instructions (Addendum)
The patient is aware to call back for any questions or concerns.  Laparoscopic Appendectomy, Adult A laparoscopic appendectomy is a surgery to take out the appendix. The appendix is a finger-like structure that is attached to the large intestine. In this surgery, the appendix is removed through two or three small cuts (incisions) with the help of a thin, lighted tube that has a tiny camera on the end (laparoscope). A laparoscopic appendectomy may be done to prevent an inflamed appendix from bursting (rupturing). Or, it may be done to treat the infection from an appendix that has already ruptured. It is usually done immediately after inflammation of the appendix (appendicitis) is diagnosed. Tell a health care provider about:  Any allergies you have.  All medicines you are taking, including vitamins, herbs, eye drops, creams, and over-the-counter medicines.  Any problems you or family members have had with anesthetic medicines.  Any blood disorders you have.  Any surgeries you have had.  Any medical conditions you have.  Whether you are pregnant or may be pregnant. What are the risks? Generally, this is a safe procedure. However, problems may occur, including:  Infection.  Bleeding.  Allergic reactions to medicines.  Damage to other structures or organs.  The formation of collections of pus (abscesses).  Long-lasting pain or scarring at the incision sites or inside the abdomen.  Blood clots in the legs.  What happens before the procedure?  Follow instructions from your health care provider about eating or drinking restrictions.  Ask your health care provider about: ? Changing or stopping your regular medicines. This is especially important if you are taking diabetes medicines or blood thinners. ? Taking medicines such as aspirin and ibuprofen. These medicines can thin your blood. Do not take these medicines before your procedure if your health care provider instructs you not  to.  Ask your health care provider how your surgical site will be marked or identified.  You may be given antibiotic medicine to help prevent infection or to treat existing inflammation or infection.  If you will be going home on the same day as your surgery, plan to have someone with you for 24 hours. What happens during the procedure?  To reduce your risk of infection: ? Your health care team will wash or sanitize their hands. ? Your skin will be washed with soap.  An IV tube will be inserted into one of your veins. You will receive medicine and fluids through this tube.  You will be given one or more of the following: ? A medicine to help you relax (sedative). ? A medicine to numb the area (local anesthetic). ? A medicine to make you fall asleep (general anesthetic). ? A medicine that is injected into your spine to numb the area below and slightly above the injection site (spinal anesthetic). ? A medicine that is injected into an area of your body to numb everything below the injection site (regional anesthetic).  A thin, flexible tube (catheter) may be put into your bladder to drain urine.  A tube may be passed through your nose and into your stomach (NG tube, or nasogastric tube) to drain any stomach contents.  Your surgeon will make two or three small incisions near your belly button (navel).  Air-like gas will be used to fill your abdomen. The gas will make your abdomen expand. This helps the surgeon to see clearly and gives him or her more room to work.  A laparoscope will be passed through one of the  incisions.  Other long, thin surgical instruments will be passed through the other incisions.  The appendix will be located and removed through one of the incisions.  The abdomen may be washed out to remove bacteria.  The incisions will be closed with stitches (sutures), staples, or adhesive strips.  A bandage (dressing) may be used to cover the incisions.  If a tube  was inserted into your bladder or stomach, it will be removed. What happens after the procedure?  Your blood pressure, heart rate, breathing rate, and blood oxygen level will be monitored often until the medicines you were given have worn off.  You will be given pain medicine as needed to keep you comfortable.  If your appendix did not rupture, you may be able to go home the same day as your surgery.  Do not drive for 24 hours if you received a sedative.  If your appendix ruptured: ? You will get antibiotic medicine through an IV tube. ? You may be sent home with a temporary drain. This information is not intended to replace advice given to you by your health care provider. Make sure you discuss any questions you have with your health care provider. Document Released: 11/03/2003 Document Revised: 08/27/2015 Document Reviewed: 09/08/2014 Elsevier Interactive Patient Education  [employer].   The patient is scheduled for surgery at [facility] on 05/29/17. She will pre admit by phone. She is aware of date and instructions.

## 2017-05-09 NOTE — Progress Notes (Signed)
Patient ID: Tammy Webb, female   DOB: [birth year], 62 y.o.   MRN: [id number]  Chief Complaint  Patient presents with  . Other    HPI Tammy Webb is a 62 y.o. female.  Here for evaluation of an adenoma of the appendix referred by Dr Servando Snare. She denies any GI issues at this time. She is a former patient of Dr Evette Cristal for left breast cancer last seen 06-02-16. She is thinking about having her breast implants removed because there seems to be a pulling sensation in her axillary area. She will see if further weight loss helps. Last colonoscopy was 05-02-17. 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  HPI  Past Medical History:  Diagnosis Date  . Cancer ([facility]) 07/2005   Left Breast   . Hypertension   . Sleep apnea     Past Surgical History:  Procedure Laterality Date  . BREAST SURGERY Bilateral 12/2005   Mastectomies with reconstruction  . CESAREAN SECTION  1993  . COLONOSCOPY  2015  . COLONOSCOPY WITH PROPOFOL N/A 05/02/2017   Procedure: COLONOSCOPY WITH PROPOFOL;  Surgeon: Midge Minium, MD;  Location: [facility] ENDOSCOPY;  Service: Endoscopy;  Laterality: N/A;  . TONSILLECTOMY  1980    Family History  Problem Relation Age of Onset  . Breast cancer Maternal Aunt   . Ovarian cancer Paternal Aunt     Social History Social History   Tobacco Use  . Smoking status: Never Smoker  . Smokeless tobacco: Never Used  Substance Use Topics  . Alcohol use: No    Alcohol/week: 0.0 oz  . Drug use: No    Allergies  Allergen Reactions  . Triamcinolone Acetonide Hives  . Morphine And Related Rash    Current Outpatient Medications  Medication Sig Dispense Refill  . calcium carbonate (TUMS - DOSED IN MG ELEMENTAL CALCIUM) 500 MG chewable tablet Chew 1 tablet by mouth daily.    . Calcium Carbonate-Vit D-Min (CALTRATE 600+D PLUS PO) Take by mouth daily.    . Coenzyme Q10 50 MG CAPS Take by mouth daily.    Marland Kitchen desonide (VERDESO) 0.05 % foam Apply topically 2 (two) times daily.    . diphenhydrAMINE  (BENADRYL) 25 MG tablet Take 25 mg by mouth every 8 (eight) hours as needed.    Marland Kitchen EPIPEN 2-PAK 0.3 MG/0.3ML SOAJ injection     . ibuprofen (ADVIL,MOTRIN) 200 MG tablet Take 200 mg by mouth every 6 (six) hours as needed.    Marland Kitchen losartan (COZAAR) 100 MG tablet     . Misc Natural Products (OSTEO BI-FLEX ADV TRIPLE ST PO) Take by mouth daily.    . mometasone (ELOCON) 0.1 % cream Apply 1 application topically daily.    . Multiple Vitamins-Minerals (MULTIVITAMIN WITH MINERALS) tablet Take 1 tablet by mouth daily.    Marland Kitchen nystatin cream (MYCOSTATIN) Apply 1 application topically as needed for dry skin.    Marland Kitchen omega-3 acid ethyl esters (LOVAZA) 1 g capsule Take by mouth 2 (two) times daily.    . phentermine (ADIPEX-P) 37.5 MG tablet Take 37.5 mg by mouth daily.  1   No current facility-administered medications for this visit.     Review of Systems Review of Systems  Constitutional: Negative.   Respiratory: Negative.   Cardiovascular: Negative.   Gastrointestinal: Negative for abdominal pain, constipation, diarrhea and nausea.    Blood pressure 136/70, pulse 74, resp. rate 16, height 5\' 2"  (1.575 m), weight 222 lb (100.7 kg).  Physical Exam Physical Exam  Constitutional: She is  oriented to person, place, and time. She appears well-developed and well-nourished.  HENT:  Mouth/Throat: Oropharynx is clear and moist.  Eyes: Conjunctivae are normal. No scleral icterus.  Neck: Neck supple.  Cardiovascular: Normal rate, regular rhythm and normal heart sounds.  Pulmonary/Chest: Effort normal and breath sounds normal.  Abdominal: Soft. Normal appearance and bowel sounds are normal. There is no tenderness.  Lymphadenopathy:    She has no cervical adenopathy.  Neurological: She is alert and oriented to person, place, and time.  Skin: Skin is warm and dry.  Psychiatric: Her behavior is normal.    Data Reviewed May 02, 2017 colonoscopy and pathology reports reviewed.  DIAGNOSIS:  A. COLON POLYP  1, APPENDICEAL ORIFICE; COLD BIOPSY:  - TUBULAR ADENOMA.  - NEGATIVE FOR HIGH-GRADE DYSPLASIA AND MALIGNANCY.   Assessment    Tubular adenoma at the appendiceal orifice.    Plan    In any other location, surgical excision would not be recommended.  As this may progress and obstruct the appendiceal orifice resulting in acute appendicitis it seems reasonable to provide the patient with a laparoscopic resection including the very distal part of the cecum to remove lesion.  Discussed laparoscopic appendectomy.  Risks associated with surgery including bleeding, infection and the potential for an open procedure were reviewed.    HPI, Physical Exam, Assessment and Plan have been scribed under the direction and in the presence of Earline Mayotte, MD. Dorathy Daft, RN   I have completed the exam and reviewed the above documentation for accuracy and completeness.  I agree with the above.  Museum/gallery conservator has been used and any errors in dictation or transcription are unintentional.  Donnalee Curry, M.D., F.A.C.S.  The patient is scheduled for surgery at [facility] on 05/29/17. She will pre admit by phone. She is aware of date and instructions. Documented by Sinda Du LPN   Merrily Pew Vera Wishart 05/09/2017, 7:25 PM

## 2017-05-16 ENCOUNTER — Telehealth: Payer: Self-pay | Admitting: *Deleted

## 2017-05-16 DIAGNOSIS — R51 Headache: Secondary | ICD-10-CM | POA: Diagnosis not present

## 2017-05-16 NOTE — Telephone Encounter (Signed)
Filing out FMLA papers, return to work date?

## 2017-05-17 DIAGNOSIS — R799 Abnormal finding of blood chemistry, unspecified: Secondary | ICD-10-CM | POA: Diagnosis not present

## 2017-05-17 DIAGNOSIS — E1165 Type 2 diabetes mellitus with hyperglycemia: Secondary | ICD-10-CM | POA: Diagnosis not present

## 2017-05-17 DIAGNOSIS — R7309 Other abnormal glucose: Secondary | ICD-10-CM | POA: Diagnosis not present

## 2017-05-17 NOTE — Telephone Encounter (Signed)
Left message needing a tentative return to work date and if she had a release of medical information form at home that needs to be signed.

## 2017-05-18 NOTE — Telephone Encounter (Signed)
Return to work date 06-12-17, forms faxed

## 2017-05-19 ENCOUNTER — Other Ambulatory Visit: Payer: Self-pay

## 2017-05-19 ENCOUNTER — Encounter
Admission: RE | Admit: 2017-05-19 | Discharge: 2017-05-19 | Disposition: A | Discharge status: Home / Self Care | Payer: 59 | Source: Ambulatory Visit | Attending: General Surgery | Admitting: General Surgery

## 2017-05-19 DIAGNOSIS — Z01818 Encounter for other preprocedural examination: Secondary | ICD-10-CM | POA: Insufficient documentation

## 2017-05-19 HISTORY — DX: Unspecified osteoarthritis, unspecified site: M19.90

## 2017-05-19 HISTORY — DX: Gastro-esophageal reflux disease without esophagitis: K21.9

## 2017-05-19 NOTE — Patient Instructions (Addendum)
Your procedure is scheduled on: Monday, February 25th  Report to [place]  To find out your arrival time please call [phone] between 1PM - 3PM on Friday, February 22   Remember: Instructions that are not followed completely may result in serious  medical risk, up to and including death, or upon the discretion of your  surgeon and anesthesiologist your surgery may need to be rescheduled.     _X__ 1. Do not eat food after midnight the night before your procedure.                 No gum chewing, lozengers or hard candies.                   You may drink clear liquids up to 2 hours                 before you are scheduled to arrive for your surgery-                  Clear Liquids include:  water, apple juice without pulp, clear carbohydrate                 drink such as Clearfast of Gartorade, Black Coffee or Tea (Do not add                 anything to coffee or tea).  __X__2.  On the morning of surgery brush your teeth with toothpaste and water,                      you may rinse your mouth with mouthwash if you wish.                              Do not swallow any toothpaste of mouthwash.     _X__ 3.  No Alcohol for 24 hours before or after surgery.   _X__ 4.  Do Not Smoke or use e-cigarettes For 24 Hours Prior to Your Surgery.                 Do not use any chewable tobacco products for at least 6 hours prior to                 surgery.  ____  5.  Bring all medications with you on the day of surgery if instructed.   ____  6.  Notify your doctor if there is any change in your medical condition      (cold, fever, infections).     Do not wear jewelry, make-up, hairpins, clips or nail polish. Do not wear lotions, powders, or perfumes. You may wear deodorant. Do not shave 48 hours prior to surgery. Men may shave face and neck. Do not bring valuables to the hospital.    [facility] is not responsible for any belongings or  valuables.  Contacts, dentures or bridgework may not be worn into surgery. Leave your suitcase in the car. After surgery it may be brought to your room. For patients admitted to the hospital, discharge time is determined by your treatment team.   Patients discharged the day of surgery will not be allowed to drive home.   Please read over the following fact sheets that you were given:   [place]  ____ Take these medicines the morning of surgery with A SIP OF WATER:    1. IF ALLERGIES ARE BOTHERING YOU, THEN USE INHALER                                AND NASAL SPRAY  2.   3.   4.  5.  6.  ____ Fleet Enema (as directed)   __X__ Use CHG Soap as directed  __X__ Use inhalers on the day of surgery IF ALLERGIES ARE BOTHERING YOU  __X__ Stop ALL ASPIRIN PRODUCTS AS OF 05/22/2017                  THIS INCLUDES GOODYS POWDER, BC POWDER, EXCEDRIN  _X___ Stop Anti-inflammatories AS OF 05/22/2017                    THIS INCLUDES IBUPROFEN / MOTRIN / ALEVE / ADVIL   __X__ Stop supplements until after surgery.                    THIS INCLUDES:                       COQ10                       OSTEO BIFLEX                       LOVAZA                       MULTIVITS   YOU MAY CONTINUE VITAMIN B12 BUT DO NOT TAKE ON DAY OF SURGERY  CONTINUE TO TAKE YOUR OTHER PRESCRIPTION MEDICATIONS AS DIRECTED.         DO NOT TAKE ON MORNING OF SURGERY.  HAVE STOOL SOFTENERS AT HOME ONCE DISCHARGED.  WEAR LOOSE CLOTHING FOR BELLY INCISION.  DO NOT USE ANY CREAMS ON YOUR SKIN ON DAY OF SURGERY.    __x__ Bring C-Pap to the hospital.   BRING A COPY OF MEDICAL DIRECTIVES IF COMPLETED.

## 2017-05-23 ENCOUNTER — Encounter
Admission: RE | Admit: 2017-05-23 | Discharge: 2017-05-23 | Disposition: A | Discharge status: Home / Self Care | Payer: 59 | Source: Ambulatory Visit | Attending: General Surgery | Admitting: General Surgery

## 2017-05-23 DIAGNOSIS — Z01812 Encounter for preprocedural laboratory examination: Secondary | ICD-10-CM | POA: Insufficient documentation

## 2017-05-23 DIAGNOSIS — I10 Essential (primary) hypertension: Secondary | ICD-10-CM | POA: Diagnosis not present

## 2017-05-23 DIAGNOSIS — Z01810 Encounter for preprocedural cardiovascular examination: Secondary | ICD-10-CM | POA: Diagnosis not present

## 2017-05-23 LAB — CBC
HEMATOCRIT: 38.8 % (ref 35.0–47.0)
HEMOGLOBIN: 13.4 g/dL (ref 12.0–16.0)
MCH: 31.1 pg (ref 26.0–34.0)
MCHC: 34.6 g/dL (ref 32.0–36.0)
MCV: 89.9 fL (ref 80.0–100.0)
Platelets: 293 10*3/uL (ref 150–440)
RBC: 4.32 MIL/uL (ref 3.80–5.20)
RDW: 13.8 % (ref 11.5–14.5)
WBC: 8.2 10*3/uL (ref 3.6–11.0)

## 2017-05-23 LAB — BASIC METABOLIC PANEL
Anion gap: 8 (ref 5–15)
BUN: 12 mg/dL (ref 6–20)
CHLORIDE: 104 mmol/L (ref 101–111)
CO2: 29 mmol/L (ref 22–32)
CREATININE: 0.72 mg/dL (ref 0.44–1.00)
Calcium: 9.1 mg/dL (ref 8.9–10.3)
GFR calc non Af Amer: >60 mL/min (ref >60)
Glucose, Bld: 110 mg/dL — ABNORMAL HIGH (ref 65–99)
Potassium: 3.9 mmol/L (ref 3.5–5.1)
Sodium: 141 mmol/L (ref 135–145)

## 2017-05-28 MED ORDER — SODIUM CHLORIDE 0.9 % IV SOLN
1.0000 g | INTRAVENOUS | Status: AC
  Administered 2017-05-29: 1 g via INTRAVENOUS
  Filled 2017-05-28: qty 1

## 2017-05-29 ENCOUNTER — Other Ambulatory Visit: Payer: Self-pay

## 2017-05-29 ENCOUNTER — Ambulatory Visit: Payer: 59 | Admitting: Anesthesiology

## 2017-05-29 ENCOUNTER — Ambulatory Visit
Admission: RE | Admit: 2017-05-29 | Discharge: 2017-05-29 | Disposition: A | Discharge status: Home / Self Care | Payer: 59 | Source: Ambulatory Visit | Attending: General Surgery | Admitting: General Surgery

## 2017-05-29 ENCOUNTER — Encounter
Admission: RE | Disposition: A | Discharge status: Home / Self Care | Payer: Self-pay | Source: Ambulatory Visit | Attending: General Surgery

## 2017-05-29 ENCOUNTER — Encounter: Payer: Self-pay | Admitting: *Deleted

## 2017-05-29 DIAGNOSIS — Z803 Family history of malignant neoplasm of breast: Secondary | ICD-10-CM | POA: Insufficient documentation

## 2017-05-29 DIAGNOSIS — D126 Benign neoplasm of colon, unspecified: Secondary | ICD-10-CM | POA: Diagnosis not present

## 2017-05-29 DIAGNOSIS — G4730 Sleep apnea, unspecified: Secondary | ICD-10-CM | POA: Diagnosis not present

## 2017-05-29 DIAGNOSIS — Z79899 Other long term (current) drug therapy: Secondary | ICD-10-CM | POA: Insufficient documentation

## 2017-05-29 DIAGNOSIS — K219 Gastro-esophageal reflux disease without esophagitis: Secondary | ICD-10-CM | POA: Diagnosis not present

## 2017-05-29 DIAGNOSIS — D121 Benign neoplasm of appendix: Secondary | ICD-10-CM

## 2017-05-29 DIAGNOSIS — Z8041 Family history of malignant neoplasm of ovary: Secondary | ICD-10-CM | POA: Insufficient documentation

## 2017-05-29 DIAGNOSIS — K635 Polyp of colon: Secondary | ICD-10-CM | POA: Diagnosis not present

## 2017-05-29 HISTORY — PX: LAPAROSCOPIC APPENDECTOMY: SHX408

## 2017-05-29 SURGERY — APPENDECTOMY, LAPAROSCOPIC
Anesthesia: General | Wound class: Clean Contaminated

## 2017-05-29 MED ORDER — MIDAZOLAM HCL 2 MG/2ML IJ SOLN
INTRAMUSCULAR | Status: AC
  Filled 2017-05-29: qty 2

## 2017-05-29 MED ORDER — BUPIVACAINE HCL (PF) 0.5 % IJ SOLN
INTRAMUSCULAR | Status: DC | PRN
  Administered 2017-05-29: 20 mL

## 2017-05-29 MED ORDER — GABAPENTIN 300 MG PO CAPS
ORAL_CAPSULE | ORAL | Status: AC
  Filled 2017-05-29: qty 1

## 2017-05-29 MED ORDER — GLYCOPYRROLATE 0.2 MG/ML IJ SOLN
INTRAMUSCULAR | Status: AC
  Filled 2017-05-29: qty 1

## 2017-05-29 MED ORDER — EPHEDRINE SULFATE 50 MG/ML IJ SOLN
INTRAMUSCULAR | Status: AC
  Filled 2017-05-29: qty 1

## 2017-05-29 MED ORDER — ROCURONIUM BROMIDE 100 MG/10ML IV SOLN
INTRAVENOUS | Status: DC | PRN
  Administered 2017-05-29: 40 mg via INTRAVENOUS

## 2017-05-29 MED ORDER — ONDANSETRON HCL 4 MG/2ML IJ SOLN
INTRAMUSCULAR | Status: DC | PRN
  Administered 2017-05-29: 4 mg via INTRAVENOUS

## 2017-05-29 MED ORDER — HYDROMORPHONE HCL 1 MG/ML IJ SOLN
INTRAMUSCULAR | Status: DC | PRN
  Administered 2017-05-29: 1 mg via INTRAVENOUS

## 2017-05-29 MED ORDER — EPHEDRINE SULFATE 50 MG/ML IJ SOLN
INTRAMUSCULAR | Status: DC | PRN
  Administered 2017-05-29: 10 mg via INTRAVENOUS

## 2017-05-29 MED ORDER — BUPIVACAINE-EPINEPHRINE (PF) 0.5% -1:200000 IJ SOLN
INTRAMUSCULAR | Status: AC
  Filled 2017-05-29: qty 30

## 2017-05-29 MED ORDER — FAMOTIDINE 20 MG PO TABS
ORAL_TABLET | ORAL | Status: AC
  Filled 2017-05-29: qty 1

## 2017-05-29 MED ORDER — ACETAMINOPHEN 10 MG/ML IV SOLN
INTRAVENOUS | Status: AC
  Filled 2017-05-29: qty 100

## 2017-05-29 MED ORDER — GABAPENTIN 300 MG PO CAPS
300.0000 mg | ORAL_CAPSULE | ORAL | Status: AC
  Administered 2017-05-29: 300 mg via ORAL

## 2017-05-29 MED ORDER — LIDOCAINE HCL (CARDIAC) 20 MG/ML IV SOLN
INTRAVENOUS | Status: DC | PRN
  Administered 2017-05-29: 80 mg via INTRAVENOUS

## 2017-05-29 MED ORDER — DIPHENHYDRAMINE HCL 50 MG/ML IJ SOLN
INTRAMUSCULAR | Status: DC | PRN
  Administered 2017-05-29: 25 mg via INTRAVENOUS

## 2017-05-29 MED ORDER — DEXAMETHASONE SODIUM PHOSPHATE 10 MG/ML IJ SOLN
INTRAMUSCULAR | Status: DC | PRN
  Administered 2017-05-29: 10 mg via INTRAVENOUS

## 2017-05-29 MED ORDER — DEXAMETHASONE SODIUM PHOSPHATE 10 MG/ML IJ SOLN
INTRAMUSCULAR | Status: AC
  Filled 2017-05-29: qty 1

## 2017-05-29 MED ORDER — LACTATED RINGERS IV SOLN
INTRAVENOUS | Status: DC
  Administered 2017-05-29 (×2): via INTRAVENOUS

## 2017-05-29 MED ORDER — ACETAMINOPHEN 10 MG/ML IV SOLN
INTRAVENOUS | Status: DC | PRN
  Administered 2017-05-29: 1000 mg via INTRAVENOUS

## 2017-05-29 MED ORDER — FAMOTIDINE 20 MG PO TABS
20.0000 mg | ORAL_TABLET | Freq: Once | ORAL | Status: AC
  Administered 2017-05-29: 20 mg via ORAL

## 2017-05-29 MED ORDER — ROCURONIUM BROMIDE 50 MG/5ML IV SOLN
INTRAVENOUS | Status: AC
  Filled 2017-05-29: qty 1

## 2017-05-29 MED ORDER — ONDANSETRON HCL 4 MG/2ML IJ SOLN: 4.0000 mg | Freq: Once | INTRAMUSCULAR | Status: DC | PRN

## 2017-05-29 MED ORDER — LIDOCAINE HCL (PF) 2 % IJ SOLN
INTRAMUSCULAR | Status: AC
  Filled 2017-05-29: qty 10

## 2017-05-29 MED ORDER — PROPOFOL 10 MG/ML IV BOLUS
INTRAVENOUS | Status: DC | PRN
  Administered 2017-05-29: 150 mg via INTRAVENOUS
  Administered 2017-05-29: 50 mg via INTRAVENOUS

## 2017-05-29 MED ORDER — PROPOFOL 10 MG/ML IV BOLUS
INTRAVENOUS | Status: AC
  Filled 2017-05-29: qty 40

## 2017-05-29 MED ORDER — SUGAMMADEX SODIUM 200 MG/2ML IV SOLN
INTRAVENOUS | Status: DC | PRN
  Administered 2017-05-29: 200 mg via INTRAVENOUS

## 2017-05-29 MED ORDER — MIDAZOLAM HCL 2 MG/2ML IJ SOLN
INTRAMUSCULAR | Status: DC | PRN
  Administered 2017-05-29: 2 mg via INTRAVENOUS

## 2017-05-29 MED ORDER — GLYCOPYRROLATE 0.2 MG/ML IJ SOLN
INTRAMUSCULAR | Status: DC | PRN
  Administered 2017-05-29 (×2): 0.1 mg via INTRAVENOUS

## 2017-05-29 MED ORDER — ONDANSETRON HCL 4 MG/2ML IJ SOLN
INTRAMUSCULAR | Status: AC
  Filled 2017-05-29: qty 2

## 2017-05-29 MED ORDER — HYDROCODONE-ACETAMINOPHEN 5-325 MG PO TABS
1.0000 | ORAL_TABLET | ORAL | 0 refills | Status: DC | PRN
Start: 2017-05-29 — End: 2017-06-08

## 2017-05-29 MED ORDER — FENTANYL CITRATE (PF) 100 MCG/2ML IJ SOLN
INTRAMUSCULAR | Status: AC
  Administered 2017-05-29: 25 ug via INTRAVENOUS
  Filled 2017-05-29: qty 2

## 2017-05-29 MED ORDER — CELECOXIB 200 MG PO CAPS
200.0000 mg | ORAL_CAPSULE | ORAL | Status: AC
  Administered 2017-05-29: 200 mg via ORAL

## 2017-05-29 MED ORDER — CELECOXIB 200 MG PO CAPS
ORAL_CAPSULE | ORAL | Status: AC
  Filled 2017-05-29: qty 1

## 2017-05-29 MED ORDER — FENTANYL CITRATE (PF) 100 MCG/2ML IJ SOLN
25.0000 ug | INTRAMUSCULAR | Status: DC | PRN
  Administered 2017-05-29 (×2): 25 ug via INTRAVENOUS

## 2017-05-29 MED ORDER — BUPIVACAINE HCL (PF) 0.5 % IJ SOLN
INTRAMUSCULAR | Status: AC
  Filled 2017-05-29: qty 30

## 2017-05-29 MED ORDER — DIPHENHYDRAMINE HCL 50 MG/ML IJ SOLN
INTRAMUSCULAR | Status: AC
  Filled 2017-05-29: qty 1

## 2017-05-29 MED ORDER — HYDROMORPHONE HCL 1 MG/ML IJ SOLN
INTRAMUSCULAR | Status: AC
  Filled 2017-05-29: qty 1

## 2017-05-29 SURGICAL SUPPLY — 35 items
BLADE SURG 11 STRL SS SAFETY (MISCELLANEOUS) ×2 IMPLANT
CANISTER SUCT 1200ML W/VALVE (MISCELLANEOUS) ×2 IMPLANT
CANNULA DILATOR 10 W/SLV (CANNULA) ×4 IMPLANT
CHLORAPREP W/TINT 26ML (MISCELLANEOUS) ×2 IMPLANT
CUTTER FLEX LINEAR 45M (STAPLE) ×2 IMPLANT
DRSG TEGADERM 2-3/8X2-3/4 SM (GAUZE/BANDAGES/DRESSINGS) ×6 IMPLANT
DRSG TELFA 4X3 1S NADH ST (GAUZE/BANDAGES/DRESSINGS) ×2 IMPLANT
ELECT REM PT RETURN 9FT ADLT (ELECTROSURGICAL) ×2
ELECTRODE REM PT RTRN 9FT ADLT (ELECTROSURGICAL) ×1 IMPLANT
GLOVE BIO SURGEON STRL SZ7.5 (GLOVE) ×2 IMPLANT
GLOVE INDICATOR 8.0 STRL GRN (GLOVE) ×2 IMPLANT
GOWN STRL REUS W/ TWL LRG LVL3 (GOWN DISPOSABLE) ×2 IMPLANT
GOWN STRL REUS W/TWL LRG LVL3 (GOWN DISPOSABLE) ×2
GRASPER SUT TROCAR 14GX15 (MISCELLANEOUS) ×2 IMPLANT
IRRIGATION STRYKERFLOW (MISCELLANEOUS) ×1 IMPLANT
IRRIGATOR STRYKERFLOW (MISCELLANEOUS) ×2
IV LACTATED RINGERS 1000ML (IV SOLUTION) IMPLANT
KIT TURNOVER KIT A (KITS) ×2 IMPLANT
LABEL OR SOLS (LABEL) ×2 IMPLANT
NDL INSUFF ACCESS 14 VERSASTEP (NEEDLE) ×2 IMPLANT
NEEDLE HYPO 22GX1.5 SAFETY (NEEDLE) ×2 IMPLANT
NS IRRIG 1000ML POUR BTL (IV SOLUTION) IMPLANT
PACK LAP CHOLECYSTECTOMY (MISCELLANEOUS) ×2 IMPLANT
POUCH ENDO CATCH 10MM SPEC (MISCELLANEOUS) ×2 IMPLANT
RELOAD 45 VASCULAR/THIN (ENDOMECHANICALS) ×2 IMPLANT
RELOAD STAPLE TA45 3.5 REG BLU (ENDOMECHANICALS) ×4 IMPLANT
SEAL FOR SCOPE WARMER C3101 (MISCELLANEOUS) ×2 IMPLANT
STRIP CLOSURE SKIN 1/2X4 (GAUZE/BANDAGES/DRESSINGS) ×2 IMPLANT
SUT VIC AB 0 CT2 27 (SUTURE) ×2 IMPLANT
SUT VIC AB 4-0 FS2 27 (SUTURE) ×2 IMPLANT
SWABSTK COMLB BENZOIN TINCTURE (MISCELLANEOUS) ×2 IMPLANT
SYR 10ML LL (SYRINGE) IMPLANT
TRAY FOLEY W/METER SILVER 16FR (SET/KITS/TRAYS/PACK) ×2 IMPLANT
TROCAR XCEL 12X100 BLDLESS (ENDOMECHANICALS) ×2 IMPLANT
TUBING INSUFFLATION (TUBING) ×2 IMPLANT

## 2017-05-29 NOTE — H&P (Signed)
No change in clinical history or exam. For appendectomy with resection of the cecum for a polyp at the appendiceal opening.

## 2017-05-29 NOTE — Anesthesia Postprocedure Evaluation (Signed)
Anesthesia Post Note  Patient: Tammy Webb  Procedure(s) Performed: APPENDECTOMY LAPAROSCOPIC (N/A )  Patient location during evaluation: PACU Anesthesia Type: General Level of consciousness: awake and alert Pain management: pain level controlled Vital Signs Assessment: post-procedure vital signs reviewed and stable Respiratory status: spontaneous breathing and respiratory function stable Cardiovascular status: stable Anesthetic complications: no     Last Vitals:  Vitals:   05/29/17 0608 05/29/17 0839  BP: (!) 147/61 132/60  Pulse: 62 (!) 59  Resp: 18 11  Temp: 36.7 C 36.9 C  SpO2: 99% 100%    Last Pain:  Vitals:   05/29/17 0839  TempSrc:   PainSc: Asleep                 [name] K

## 2017-05-29 NOTE — Discharge Instructions (Signed)
AMBULATORY SURGERY  °DISCHARGE INSTRUCTIONS ° ° °1) The drugs that you were given will stay in your system until tomorrow so for the next 24 hours you should not: ° °A) Drive an automobile °B) Make any legal decisions °C) Drink any alcoholic beverage ° ° °2) You may resume regular meals tomorrow.  Today it is better to start with liquids and gradually work up to solid foods. ° °You may eat anything you prefer, but it is better to start with liquids, then soup and crackers, and gradually work up to solid foods. ° ° °3) Please notify your doctor immediately if you have any unusual bleeding, trouble breathing, redness and pain at the surgery site, drainage, fever, or pain not relieved by medication. ° ° ° °4) Additional Instructions: ° ° ° ° ° ° ° °Please contact your physician with any problems or Same Day Surgery at [phone], Monday through Friday 6 am to 4 pm, or [facility] [place] Main number at [phone].AMBULATORY SURGERY  °DISCHARGE INSTRUCTIONS ° ° °5) The drugs that you were given will stay in your system until tomorrow so for the next 24 hours you should not: ° °D) Drive an automobile °E) Make any legal decisions °F) Drink any alcoholic beverage ° ° °6) You may resume regular meals tomorrow.  Today it is better to start with liquids and gradually work up to solid foods. ° °You may eat anything you prefer, but it is better to start with liquids, then soup and crackers, and gradually work up to solid foods. ° ° °7) Please notify your doctor immediately if you have any unusual bleeding, trouble breathing, redness and pain at the surgery site, drainage, fever, or pain not relieved by medication. ° ° ° °8) Additional Instructions: ° ° ° ° ° ° ° °Please contact your physician with any problems or Same Day Surgery at [phone], Monday through Friday 6 am to 4 pm, or [facility] [place] [facility] number at [phone]. °

## 2017-05-29 NOTE — Anesthesia Post-op Follow-up Note (Signed)
Anesthesia QCDR form completed.        

## 2017-05-29 NOTE — Anesthesia Procedure Notes (Signed)
Procedure Name: Intubation Date/Time: 05/29/2017 7:30 AM Performed by: Nile Riggs, CRNA Pre-anesthesia Checklist: Patient identified, Emergency Drugs available, Suction available and Patient being monitored Patient Re-evaluated:Patient Re-evaluated prior to induction Oxygen Delivery Method: Circle system utilized Preoxygenation: Pre-oxygenation with 100% oxygen Induction Type: IV induction Ventilation: Mask ventilation without difficulty Laryngoscope Size: Miller and 2 Grade View: Grade II Tube type: Oral Tube size: 7.5 mm Number of attempts: 1 Airway Equipment and Method: Stylet Placement Confirmation: ETT inserted through vocal cords under direct vision,  positive ETCO2,  CO2 detector and breath sounds checked- equal and bilateral Secured at: 21 cm Tube secured with: Tape Dental Injury: Teeth and Oropharynx as per pre-operative assessment

## 2017-05-29 NOTE — Op Note (Signed)
Preoperative diagnosis: Polyp of the appendiceal orifice.  Postoperative diagnosis: Same.  Operative procedure: Laparoscopic appendectomy with resection of the base of the cecum.  Operating Surgeon: Hervey Ard, MD.  Anesthesia: General endotracheal, Marcaine 0.5%, plain, 20 cc.  Estimated blood loss: 5 cc.  Clinical note: This 62 year old woman underwent screening colonoscopy and was found to have a polyp at the appendiceal orifice.  Pathology showed a tubular adenoma.  The endoscopist described the polyp extending into the lumen of the appendix and it could not be entirely resected.  Based on its location and the possibility of developing appendicitis it was elected to proceed to elective appendectomy removing the base of the cecum to include the base of the polyp.  The patient received Invanz prior to the procedure.  SCD stockings for DVT prevention.  Operative note: With the patient under adequate general endotracheal anesthesia the abdomen was prepped with ChloraPrep and draped.  In Trendelenburg position a varies needle was placed through a trans-umbilical incision.  After assuring intra-abdominal location with a hanging drop test the abdomen was insufflated with CO2 at 10 mmHg pressure.  A 10 mm Step port was expanded and inspection showed no evidence of injury from initial port placement.  A similar 10 mm Step port was placed in the hypogastrium above the level of her chronic dermatitis.  812 mm XL port was placed in the left lateral abdominal wall.  These latter 2 ports were placed under direct vision.  The abdominal wall is somewhat soft and amorphous, and the greatest difficulty in the case was having the port stay in location.  The patient developed transient bradycardia down to 30 (baseline 50-55).  Insufflation pressure was lowered to 8 mm and the patient was taken out of some of the left lateral rotation she had been placed in for the procedure with resolution.  The appendix  was identified and partially retrocecal.  The mesoappendix was divided with a vascular cartridge.  Small amount of bleeding was noted that stopped spontaneously.  The base of the cecum was freed for about 1.5-2 cm above the visible junction of the appendix with the cecum to be sure to include the polyp reported on endoscopy.  2 applications of the Endo GIA blue cartridge were utilized with no spillage and complete sealing of the cecum.  The appendix was placed into an Endo Catch bag and then delivered to the 12 mm port site.  After reestablishing pneumoperitoneum the right lower quadrant was irrigated with saline and the small amount of clot extracted.  No bleeding from the mesoappendix of the base of the cecum.  The 12 mm port site was closed with a single 0 Vicryl suture.  Skin incisions were closed with 4-0 Vicryl subarticular sutures.  Benzoin, Steri-Strips, Telfa and Tegaderm dressings were applied.  The patient tolerated the procedure well and was taken recovery room in stable condition.

## 2017-05-29 NOTE — Anesthesia Preprocedure Evaluation (Signed)
Anesthesia Evaluation  Patient identified by MRN, date of birth, ID band Patient awake    Reviewed: Allergy & Precautions, NPO status , Patient's Chart, lab work & pertinent test results  History of Anesthesia Complications Negative for: history of anesthetic complications  Airway Mallampati: II       Dental   Pulmonary sleep apnea and Continuous Positive Airway Pressure Ventilation , neg COPD,           Cardiovascular hypertension, Pt. on medications (-) Past MI and (-) CHF (-) dysrhythmias (-) Valvular Problems/Murmurs     Neuro/Psych neg Seizures    GI/Hepatic Neg liver ROS, GERD  Medicated and Controlled,  Endo/Other  neg diabetes  Renal/GU negative Renal ROS     Musculoskeletal   Abdominal   Peds  Hematology   Anesthesia Other Findings   Reproductive/Obstetrics                             Anesthesia Physical Anesthesia Plan  ASA: II  Anesthesia Plan: General   Post-op Pain Management:    Induction: Intravenous  PONV Risk Score and Plan: 3 and Dexamethasone, Ondansetron and Midazolam  Airway Management Planned: Oral ETT  Additional Equipment:   Intra-op Plan:   Post-operative Plan:   Informed Consent: I have reviewed the patients History and Physical, chart, labs and discussed the procedure including the risks, benefits and alternatives for the proposed anesthesia with the patient or authorized representative who has indicated his/her understanding and acceptance.     Plan Discussed with:   Anesthesia Plan Comments:         Anesthesia Quick Evaluation

## 2017-05-29 NOTE — Transfer of Care (Signed)
Immediate Anesthesia Transfer of Care Note  Patient: Tammy Webb  Procedure(s) Performed: APPENDECTOMY LAPAROSCOPIC (N/A )  Patient Location: PACU  Anesthesia Type:General  Level of Consciousness: awake, alert , oriented and patient cooperative  Airway & Oxygen Therapy: Patient Spontanous Breathing and Patient connected to face mask oxygen  Post-op Assessment: Report given to RN, Post -op Vital signs reviewed and stable and Patient moving all extremities X 4  Post vital signs: Reviewed and stable  Last Vitals:  Vitals:   05/29/17 0608  BP: (!) 147/61  Pulse: 62  Resp: 18  Temp: 36.7 C  SpO2: 99%    Last Pain:  Vitals:   05/29/17 0608  TempSrc: Tympanic         Complications: No apparent anesthesia complications

## 2017-05-30 ENCOUNTER — Ambulatory Visit (INDEPENDENT_AMBULATORY_CARE_PROVIDER_SITE_OTHER): Payer: 59 | Admitting: Obstetrics and Gynecology

## 2017-05-30 ENCOUNTER — Encounter: Payer: Self-pay | Admitting: Obstetrics and Gynecology

## 2017-05-30 VITALS — BP 140/80 | HR 82 | Ht 62.0 in | Wt 225.6 lb

## 2017-05-30 DIAGNOSIS — N952 Postmenopausal atrophic vaginitis: Secondary | ICD-10-CM

## 2017-05-30 DIAGNOSIS — Z853 Personal history of malignant neoplasm of breast: Secondary | ICD-10-CM

## 2017-05-30 DIAGNOSIS — Z9049 Acquired absence of other specified parts of digestive tract: Secondary | ICD-10-CM

## 2017-05-30 DIAGNOSIS — N951 Menopausal and female climacteric states: Secondary | ICD-10-CM

## 2017-05-30 DIAGNOSIS — Z01419 Encounter for gynecological examination (general) (routine) without abnormal findings: Secondary | ICD-10-CM | POA: Diagnosis not present

## 2017-05-30 MED ORDER — ABDOMINAL BINDER/ELASTIC LARGE MISC: 0 refills | Status: DC

## 2017-05-30 NOTE — Patient Instructions (Addendum)
Health Maintenance for Postmenopausal Women Menopause is a normal process in which your reproductive ability comes to an end. This process happens gradually over a span of months to years, usually between the ages of 22 and 9. Menopause is complete when you have missed 12 consecutive menstrual periods. It is important to talk with your health care provider about some of the most common conditions that affect postmenopausal women, such as heart disease, cancer, and bone loss (osteoporosis). Adopting a healthy lifestyle and getting preventive care can help to promote your health and wellness. Those actions can also lower your chances of developing some of these common conditions. What should I know about menopause? During menopause, you may experience a number of symptoms, such as:  Moderate-to-severe hot flashes.  Night sweats.  Decrease in sex drive.  Mood swings.  Headaches.  Tiredness.  Irritability.  Memory problems.  Insomnia.  Choosing to treat or not to treat menopausal changes is an individual decision that you make with your health care provider. What should I know about hormone replacement therapy and supplements? Hormone therapy products are effective for treating symptoms that are associated with menopause, such as hot flashes and night sweats. Hormone replacement carries certain risks, especially as you become older. If you are thinking about using estrogen or estrogen with progestin treatments, discuss the benefits and risks with your health care provider. What should I know about heart disease and stroke? Heart disease, heart attack, and stroke become more likely as you age. This may be due, in part, to the hormonal changes that your body experiences during menopause. These can affect how your body processes dietary fats, triglycerides, and cholesterol. Heart attack and stroke are both medical emergencies. There are many things that you can do to help prevent heart disease  and stroke:  Have your blood pressure checked at least every 1-2 years. High blood pressure causes heart disease and increases the risk of stroke.  If you are 53-22 years old, ask your health care provider if you should take aspirin to prevent a heart attack or a stroke.  Do not use any tobacco products, including cigarettes, chewing tobacco, or electronic cigarettes. If you need help quitting, ask your health care provider.  It is important to eat a healthy diet and maintain a healthy weight. ? Be sure to include plenty of vegetables, fruits, low-fat dairy products, and lean protein. ? Avoid eating foods that are high in solid fats, added sugars, or salt (sodium).  Get regular exercise. This is one of the most important things that you can do for your health. ? Try to exercise for at least 150 minutes each week. The type of exercise that you do should increase your heart rate and make you sweat. This is known as moderate-intensity exercise. ? Try to do strengthening exercises at least twice each week. Do these in addition to the moderate-intensity exercise.  Know your numbers.Ask your health care provider to check your cholesterol and your blood glucose. Continue to have your blood tested as directed by your health care provider.  What should I know about cancer screening? There are several types of cancer. Take the following steps to reduce your risk and to catch any cancer development as early as possible. Breast Cancer  Practice breast self-awareness. ? This means understanding how your breasts normally appear and feel. ? It also means doing regular breast self-exams. Let your health care provider know about any changes, no matter how small.  If you are 40  or older, have a clinician do a breast exam (clinical breast exam or CBE) every year. Depending on your age, family history, and medical history, it may be recommended that you also have a yearly breast X-ray (mammogram).  If you  have a family history of breast cancer, talk with your health care provider about genetic screening.  If you are at high risk for breast cancer, talk with your health care provider about having an MRI and a mammogram every year.  Breast cancer (BRCA) gene test is recommended for women who have family members with BRCA-related cancers. Results of the assessment will determine the need for genetic counseling and BRCA1 and for BRCA2 testing. BRCA-related cancers include these types: ? Breast. This occurs in males or females. ? Ovarian. ? Tubal. This may also be called fallopian tube cancer. ? Cancer of the abdominal or pelvic lining (peritoneal cancer). ? Prostate. ? Pancreatic.  Cervical, Uterine, and Ovarian Cancer Your health care provider may recommend that you be screened regularly for cancer of the pelvic organs. These include your ovaries, uterus, and vagina. This screening involves a pelvic exam, which includes checking for microscopic changes to the surface of your cervix (Pap test).  For women ages 21-65, health care providers may recommend a pelvic exam and a Pap test every three years. For women ages 79-65, they may recommend the Pap test and pelvic exam, combined with testing for human papilloma virus (HPV), every five years. Some types of HPV increase your risk of cervical cancer. Testing for HPV may also be done on women of any age who have unclear Pap test results.  Other health care providers may not recommend any screening for nonpregnant women who are considered low risk for pelvic cancer and have no symptoms. Ask your health care provider if a screening pelvic exam is right for you.  If you have had past treatment for cervical cancer or a condition that could lead to cancer, you need Pap tests and screening for cancer for at least 20 years after your treatment. If Pap tests have been discontinued for you, your risk factors (such as having a new sexual partner) need to be  reassessed to determine if you should start having screenings again. Some women have medical problems that increase the chance of getting cervical cancer. In these cases, your health care provider may recommend that you have screening and Pap tests more often.  If you have a family history of uterine cancer or ovarian cancer, talk with your health care provider about genetic screening.  If you have vaginal bleeding after reaching menopause, tell your health care provider.  There are currently no reliable tests available to screen for ovarian cancer.  Lung Cancer Lung cancer screening is recommended for adults 69-62 years old who are at high risk for lung cancer because of a history of smoking. A yearly low-dose CT scan of the lungs is recommended if you:  Currently smoke.  Have a history of at least 30 pack-years of smoking and you currently smoke or have quit within the past 15 years. A pack-year is smoking an average of one pack of cigarettes per day for one year.  Yearly screening should:  Continue until it has been 15 years since you quit.  Stop if you develop a health problem that would prevent you from having lung cancer treatment.  Colorectal Cancer  This type of cancer can be detected and can often be prevented.  Routine colorectal cancer screening usually begins at  age 42 and continues through age 45.  If you have risk factors for colon cancer, your health care provider may recommend that you be screened at an earlier age.  If you have a family history of colorectal cancer, talk with your health care provider about genetic screening.  Your health care provider may also recommend using home test kits to check for hidden blood in your stool.  A small camera at the end of a tube can be used to examine your colon directly (sigmoidoscopy or colonoscopy). This is done to check for the earliest forms of colorectal cancer.  Direct examination of the colon should be repeated every  5-10 years until age 71. However, if early forms of precancerous polyps or small growths are found or if you have a family history or genetic risk for colorectal cancer, you may need to be screened more often.  Skin Cancer  Check your skin from head to toe regularly.  Monitor any moles. Be sure to tell your health care provider: ? About any new moles or changes in moles, especially if there is a change in a mole's shape or color. ? If you have a mole that is larger than the size of a pencil eraser.  If any of your family members has a history of skin cancer, especially at a young age, talk with your health care provider about genetic screening.  Always use sunscreen. Apply sunscreen liberally and repeatedly throughout the day.  Whenever you are outside, protect yourself by wearing long sleeves, pants, a wide-brimmed hat, and sunglasses.  What should I know about osteoporosis? Osteoporosis is a condition in which bone destruction happens more quickly than new bone creation. After menopause, you may be at an increased risk for osteoporosis. To help prevent osteoporosis or the bone fractures that can happen because of osteoporosis, the following is recommended:  If you are 46-71 years old, get at least 1,000 mg of calcium and at least 600 mg of vitamin D per day.  If you are older than age 55 but younger than age 65, get at least 1,200 mg of calcium and at least 600 mg of vitamin D per day.  If you are older than age 54, get at least 1,200 mg of calcium and at least 800 mg of vitamin D per day.  Smoking and excessive alcohol intake increase the risk of osteoporosis. Eat foods that are rich in calcium and vitamin D, and do weight-bearing exercises several times each week as directed by your health care provider. What should I know about how menopause affects my mental health? Depression may occur at any age, but it is more common as you become older. Common symptoms of depression  include:  Low or sad mood.  Changes in sleep patterns.  Changes in appetite or eating patterns.  Feeling an overall lack of motivation or enjoyment of activities that you previously enjoyed.  Frequent crying spells.  Talk with your health care provider if you think that you are experiencing depression. What should I know about immunizations? It is important that you get and maintain your immunizations. These include:  Tetanus, diphtheria, and pertussis (Tdap) booster vaccine.  Influenza every year before the flu season begins.  Pneumonia vaccine.  Shingles vaccine.  Your health care provider may also recommend other immunizations. This information is not intended to replace advice given to you by your health care provider. Make sure you discuss any questions you have with your health care provider. Document Released: 05/13/2005  Document Revised: 10/09/2015 Document Reviewed: 12/23/2014 [employer] Interactive Patient Education  2018 [employer]  

## 2017-05-30 NOTE — Progress Notes (Signed)
ANNUAL PREVENTATIVE CARE GYNECOLOGY  ENCOUNTER NOTE  Subjective:       Tammy Webb is a 62 y.o. G43P1020 female here for a routine annual gynecologic exam.  Patient has a PMH of left breast cancer, s/p bilateral mastectomy with reconstruction and implants. The patient is not currently sexually active. The patient has never taken hormone replacement therapy. Patient denies post-menopausal vaginal bleeding. The patient wears seatbelts: yes. The patient participates in regular exercise: no. Has the patient ever been transfused or tattooed?: no. The patient reports that there is not domestic violence in her life.  Current complaints: 1.  None today. Does note that she is currently 1 day s/p laparoscopic appendectomy (due to adenom,a of appendix noted on recent colonoscopy).    Gynecologic History No LMP recorded. Patient is postmenopausal. Contraception: post menopausal status Last Pap: 05/25/2016 . Results were: normal Last mammogram: ~2007.  Last Colonoscopy:  05/02/2017.  H/o colon polyps.    Obstetric History OB History  Gravida Para Term Preterm AB Living  3 1 1   2     SAB TAB Ectopic Multiple Live Births  1            # Outcome Date GA Lbr Len/2nd Weight Sex Delivery Anes PTL Lv  3 Term 1993   8 lb 1.8 oz (3.679 kg) F CS-Unspec     2 AB           1 SAB               Past Medical History:  Diagnosis Date  . Arthritis    knees  . Cancer ([place]) 07/2005   Left Breast   . GERD (gastroesophageal reflux disease)   . Hypertension   . Sleep apnea    cpap    Family History  Problem Relation Age of Onset  . Breast cancer Maternal Aunt   . Ovarian cancer Paternal Aunt     Past Surgical History:  Procedure Laterality Date  . BREAST SURGERY Bilateral 12/2005   Mastectomies with reconstruction  . CESAREAN SECTION  1993  . COLONOSCOPY  2015  . COLONOSCOPY WITH PROPOFOL N/A 05/02/2017   Procedure: COLONOSCOPY WITH PROPOFOL;  Surgeon: Lucilla Lame, MD;  Location: [facility]  ENDOSCOPY;  Service: Endoscopy;  Laterality: N/A;  . LAPAROSCOPIC APPENDECTOMY N/A 05/29/2017   Procedure: APPENDECTOMY LAPAROSCOPIC;  Surgeon: Robert Bellow, MD;  Location: [facility] ORS;  Service: General;  Laterality: N/A;  . TONSILLECTOMY  1980    Social History   Socioeconomic History  . Marital status: Divorced    Spouse name: Not on file  . Number of children: Not on file  . Years of education: Not on file  . Highest education level: Not on file  Social Needs  . Financial resource strain: Not on file  . Food insecurity - worry: Not on file  . Food insecurity - inability: Not on file  . Transportation needs - medical: Not on file  . Transportation needs - non-medical: Not on file  Occupational History  . Not on file  Tobacco Use  . Smoking status: Never Smoker  . Smokeless tobacco: Never Used  Substance and Sexual Activity  . Alcohol use: No    Alcohol/week: 0.0 oz    Comment: occasionally  . Drug use: No  . Sexual activity: Not Currently    Birth control/protection: Post-menopausal  Other Topics Concern  . Not on file  Social History Narrative  . Not on file    Current Outpatient  Medications on File Prior to Visit  Medication Sig Dispense Refill  . bacitracin 500 UNIT/GM ointment Apply 1 application topically daily as needed for wound care.    . Calcium Carbonate-Vit D-Min (CALTRATE 600+D PLUS PO) Take 1 tablet by mouth once a week. IN THE EVENING    . Coenzyme Q10 50 MG CAPS Take 50 mg by mouth every evening.     . Dermatological Products, Misc. (HPR PLUS) CREA Apply 1 Dose topically as needed. For skin irritation    . diphenhydrAMINE (BENADRYL) 25 MG tablet Take 25 mg by mouth every 8 (eight) hours as needed (FOR ALLERGIES.).     Marland Kitchen Fluticasone Propionate 93 MCG/ACT EXHU Place 1 Dose into the nose as needed.    Marland Kitchen HYDROcodone-acetaminophen (NORCO/VICODIN) 5-325 MG tablet Take 1 tablet by mouth every 4 (four) hours as needed for moderate pain. 12 tablet 0  .  Hyprom-Naphaz-Polysorb-Zn Sulf (CLEAR EYES COMPLETE OP) Apply 1 drop to eye. As needed for eyes    . ibuprofen (ADVIL,MOTRIN) 200 MG tablet Take 400 mg by mouth every 8 (eight) hours as needed (for pain.).     Jerrye Beavers Polysacch (ALCORTIN A) 1-2-1 % GEL Apply 1 Dose topically as needed (for irritation).    Marland Kitchen loratadine (CLARITIN) 10 MG tablet Take 10 mg by mouth every Saturday. In the morning.    Marland Kitchen losartan (COZAAR) 100 MG tablet Take 100 mg by mouth every evening.     . Misc Natural Products (OSTEO BI-FLEX ADV TRIPLE ST PO) Take 1 tablet by mouth daily.     . mometasone (ELOCON) 0.1 % lotion Apply 1 Dose topically daily as needed. For ears    . desonide (VERDESO) 0.05 % foam Apply 1 application topically daily.     Marland Kitchen EPIPEN 2-PAK 0.3 MG/0.3ML SOAJ injection Inject 0.3 mg into the muscle daily as needed (FOR ALLERGIC REACTIONS.).     Marland Kitchen Multiple Vitamins-Minerals (MULTIVITAMIN WITH MINERALS) tablet Take 1 tablet by mouth daily. Centrum Silver    . Naproxen Sod-Diphenhydramine (ALEVE PM PO) Take 1 capsule by mouth as needed.    . nystatin cream (MYCOSTATIN) Apply 1 application topically 2 (two) times daily as needed (for dry skin/skin fungus.).     Marland Kitchen Omega-3 Fatty Acids (OMEGA 3 PO) Take 1 capsule by mouth once a week. Complete Omega    . phentermine (ADIPEX-P) 37.5 MG tablet Take 18.75 mg by mouth daily before breakfast.   1  . Polyethyl Glycol-Propyl Glycol (SYSTANE ULTRA OP) Apply 1 drop to eye daily as needed. For eyes    . triamcinolone cream (KENALOG) 0.1 % Apply 1 application topically 2 (two) times daily as needed.     No current facility-administered medications on file prior to visit.     Allergies  Allergen Reactions  . Morphine And Related Rash    Patient not sure of where reaction came from  . Seasonal Ic [Cholestatin] Other (See Comments)    Runny nose, etc  . Triamcinolone Acetonide Hives    Patient unaware of this reaction     Review of Systems ROS Review of  Systems - General ROS: negative for - chills, fatigue, fever, hot flashes, night sweats, weight gain or weight loss Psychological ROS: negative for - anxiety, decreased libido, depression, mood swings, physical abuse or sexual abuse Ophthalmic ROS: negative for - blurry vision, eye pain or loss of vision ENT ROS: negative for - headaches, hearing change, visual changes or vocal changes Allergy and Immunology ROS: negative for -  hives, itchy/watery eyes or seasonal allergies Hematological and Lymphatic ROS: negative for - bleeding problems, bruising, swollen lymph nodes or weight loss Endocrine ROS: negative for - galactorrhea, hair pattern changes, hot flashes, malaise/lethargy, mood swings, palpitations, polydipsia/polyuria, skin changes, temperature intolerance or unexpected weight changes Breast ROS: negative for - new or changing breast lumps or nipple discharge Respiratory ROS: negative for - cough or shortness of breath Cardiovascular ROS: negative for - chest pain, irregular heartbeat, palpitations or shortness of breath Gastrointestinal ROS: surgical pain at incision sites (appropriate). No abdominal pain, change in bowel habits, or black or bloody stools Genito-Urinary ROS: no dysuria, trouble voiding, or hematuria Musculoskeletal ROS: negative for - joint pain or joint stiffness Neurological ROS: negative for - bowel and bladder control changes Dermatological ROS: negative for rash and skin lesion changes   Objective:   BP 140/80   Pulse 82   Ht 5\' 2"  (1.575 m)   Wt 225 lb 9.6 oz (102.3 kg)   BMI 41.26 kg/m  CONSTITUTIONAL: Well-developed, well-nourished female in no acute distress.  PSYCHIATRIC: Normal mood and affect. Normal behavior. Normal judgment and thought content. [place]: Alert and oriented to person, place, and time. Normal muscle tone coordination. No cranial nerve deficit noted. HENT:  Normocephalic, atraumatic, External right and left ear normal. Oropharynx is  clear and moist EYES: Conjunctivae and EOM are normal. Pupils are equal, round, and reactive to light. No scleral icterus.  NECK: Normal range of motion, supple, no masses.  Normal thyroid.  SKIN: Skin is warm and dry. No rash noted. Not diaphoretic. No erythema. No pallor. CARDIOVASCULAR: Normal heart rate noted, regular rhythm, no murmur. RESPIRATORY: Clear to auscultation bilaterally. Effort and breath sounds normal, no problems with respiration noted. BREASTS: Bilateral mastectomy scars present, implants palpable, no leakage.  ABDOMEN: Soft, normal bowel sounds, no distention noted.  Mild tenderness at incision sites. No rebound or guarding. Incision dressings clean/dry/intact.  BLADDER: Normal PELVIC:  Bladder no bladder distension noted  Urethra: normal appearing urethra with no masses, tenderness or lesions  Vulva: normal appearing vulva with no masses, tenderness or lesions  Vagina: mildly atrophic, otherwise normal appearing vagina with normal color and discharge, no lesions  Cervix: normal appearing cervix without discharge or lesions  Uterus: uterus is normal size, shape, consistency and nontender  Adnexa: normal adnexa in size, nontender and no masses  RV: External Exam NormaI, No Rectal Masses and Normal Sphincter tone  MUSCULOSKELETAL: Normal range of motion. No tenderness.  No cyanosis, clubbing, or edema.  2+ distal pulses. LYMPHATIC: No Axillary, Supraclavicular, or Inguinal Adenopathy.    Labs:   Lab Results  Component Value Date   WBC 8.2 05/23/2017   HGB 13.4 05/23/2017   HCT 38.8 05/23/2017   MCV 89.9 05/23/2017   PLT 293 05/23/2017      Chemistry      Component Value Date/Time   NA 141 05/23/2017 1529   K 3.9 05/23/2017 1529   CL 104 05/23/2017 1529   CO2 29 05/23/2017 1529   BUN 12 05/23/2017 1529   CREATININE 0.72 05/23/2017 1529      Component Value Date/Time   CALCIUM 9.1 05/23/2017 1529      No results found for: TSH  No results found for:  CHOL, HDL, LDLCALC, LDLDIRECT, TRIG, CHOLHDL  No results found for: HGBA1C   Assessment:   Annual gynecologic examination 62 y.o. Contraception: post menopausal status Obesity (Class III) H/o breast cancer Vaginal atrophy S/p laparoscopic appendectomy  Plan:  Pap: Pap  Co Test not indicated. Currently up to date.  Mammogram: Not Indicated.  Patient is s/p double mastectomy for h/o breast cancer with bilateral implants and reconstruction.  However notes that her implants have approached 10 years, and needs to have them removed/replaced.  Needs to make General Surgery aware.  Stool Guaiac Testing:  Not Indicated.  Patient is s/p recent colonoscopy.  Labs: None ordered.  Patient has plans to be seen by PCP in the next 1-2 months, will have them performed at that time.  Routine preventative health maintenance measures emphasized: Exercise/Diet/Weight control, Alcohol/Substance use risks and Stress Management Vaginal atrophy, not bothersome to patient.  No need for intervention at this time.  Is 1 day post-op for laparoscopic appendectomy, doing well post-operatively. Will need to f/u with General Surgery at recommended scheduled follow up.  Return to [place], MD  [facility]

## 2017-05-31 ENCOUNTER — Telehealth: Payer: Self-pay | Admitting: General Surgery

## 2017-05-31 DIAGNOSIS — E119 Type 2 diabetes mellitus without complications: Secondary | ICD-10-CM | POA: Diagnosis not present

## 2017-05-31 LAB — SURGICAL PATHOLOGY

## 2017-05-31 NOTE — Telephone Encounter (Signed)
Notified path benign. Reports doing well, making use of advil for discomfort. F/U next week as scheduled.

## 2017-06-08 ENCOUNTER — Telehealth: Payer: Self-pay | Admitting: Obstetrics and Gynecology

## 2017-06-08 ENCOUNTER — Encounter: Payer: Self-pay | Admitting: General Surgery

## 2017-06-08 ENCOUNTER — Ambulatory Visit (INDEPENDENT_AMBULATORY_CARE_PROVIDER_SITE_OTHER): Payer: 59 | Admitting: General Surgery

## 2017-06-08 VITALS — BP 158/72 | HR 66 | Resp 16 | Ht 62.0 in | Wt 222.0 lb

## 2017-06-08 DIAGNOSIS — D121 Benign neoplasm of appendix: Secondary | ICD-10-CM

## 2017-06-08 NOTE — Patient Instructions (Signed)
Patient to return as needed. The patient is aware to call back for any questions or concerns. 

## 2017-06-08 NOTE — Telephone Encounter (Signed)
Patient came in stating Dr Marcelline Mates wanted her to have blood work done at her last visit on 2/26. I did not see any orders in her chart. Does the patient in fact need blood work? She also stated she would like TSH and T4 at the same time.

## 2017-06-08 NOTE — Progress Notes (Signed)
Patient ID: Tammy Webb, female   DOB: 07-Sep-1955, 62 y.o.   MRN: [id number]  Chief Complaint  Patient presents with  . Routine Post Op    HPI Tammy Webb is a 62 y.o. female here today for her follow up appendectomy done on 05/29/2017. Patient states she is doing well. No GI issues.  HPI  Past Medical History:  Diagnosis Date  . Arthritis    knees  . Cancer ([facility]) 07/2005   Left Breast   . GERD (gastroesophageal reflux disease)   . Hypertension   . Sleep apnea    cpap    Past Surgical History:  Procedure Laterality Date  . BREAST SURGERY Bilateral 12/2005   Mastectomies with reconstruction  . CESAREAN SECTION  1993  . COLONOSCOPY  2015  . COLONOSCOPY WITH PROPOFOL N/A 05/02/2017   Procedure: COLONOSCOPY WITH PROPOFOL;  Surgeon: Midge Minium, MD;  Location: [facility] ENDOSCOPY;  Service: Endoscopy;  Laterality: N/A;  . LAPAROSCOPIC APPENDECTOMY N/A 05/29/2017   Procedure: APPENDECTOMY LAPAROSCOPIC;  Surgeon: Earline Mayotte, MD;  Location: [facility] ORS;  Service: General;  Laterality: N/A;  . TONSILLECTOMY  1980    Family History  Problem Relation Age of Onset  . Breast cancer Maternal Aunt   . Ovarian cancer Paternal Aunt     Social History Social History   Tobacco Use  . Smoking status: Never Smoker  . Smokeless tobacco: Never Used  Substance Use Topics  . Alcohol use: No    Alcohol/week: 0.0 oz    Comment: occasionally  . Drug use: No    Allergies  Allergen Reactions  . Morphine And Related Rash    Patient not sure of where reaction came from  . Seasonal Ic [Cholestatin] Other (See Comments)    Runny nose, etc  . Triamcinolone Acetonide Hives    Patient unaware of this reaction    Current Outpatient Medications  Medication Sig Dispense Refill  . bacitracin 500 UNIT/GM ointment Apply 1 application topically daily as needed for wound care.    . Calcium Carbonate-Vit D-Min (CALTRATE 600+D PLUS PO) Take 1 tablet by mouth once a week. IN THE EVENING    .  Coenzyme Q10 50 MG CAPS Take 50 mg by mouth every evening.     . Dermatological Products, Misc. (HPR PLUS) CREA Apply 1 Dose topically as needed. For skin irritation    . desonide (VERDESO) 0.05 % foam Apply 1 application topically daily.     . diphenhydrAMINE (BENADRYL) 25 MG tablet Take 25 mg by mouth every 8 (eight) hours as needed (FOR ALLERGIES.).     Marland Kitchen Elastic Bandages & Supports (ABDOMINAL BINDER/ELASTIC LARGE) MISC 1 device to use daily 1 each 0  . EPIPEN 2-PAK 0.3 MG/0.3ML SOAJ injection Inject 0.3 mg into the muscle daily as needed (FOR ALLERGIC REACTIONS.).     Marland Kitchen Fluticasone Propionate 93 MCG/ACT EXHU Place 1 Dose into the nose as needed.    . Hyprom-Naphaz-Polysorb-Zn Sulf (CLEAR EYES COMPLETE OP) Apply 1 drop to eye. As needed for eyes    . ibuprofen (ADVIL,MOTRIN) 200 MG tablet Take 400 mg by mouth every 8 (eight) hours as needed (for pain.).     Valerie Salts Polysacch (ALCORTIN A) 1-2-1 % GEL Apply 1 Dose topically as needed (for irritation).    Marland Kitchen loratadine (CLARITIN) 10 MG tablet Take 10 mg by mouth every Saturday. In the morning.    Marland Kitchen losartan (COZAAR) 100 MG tablet Take 100 mg by mouth every evening.     Marland Kitchen  Misc Natural Products (OSTEO BI-FLEX ADV TRIPLE ST PO) Take 1 tablet by mouth daily.     . mometasone (ELOCON) 0.1 % lotion Apply 1 Dose topically daily as needed. For ears    . Multiple Vitamins-Minerals (MULTIVITAMIN WITH MINERALS) tablet Take 1 tablet by mouth daily. Centrum Silver    . nystatin cream (MYCOSTATIN) Apply 1 application topically 2 (two) times daily as needed (for dry skin/skin fungus.).     Marland Kitchen Omega-3 Fatty Acids (OMEGA 3 PO) Take 1 capsule by mouth once a week. Complete Omega    . phentermine (ADIPEX-P) 37.5 MG tablet Take 18.75 mg by mouth daily before breakfast.   1  . Polyethyl Glycol-Propyl Glycol (SYSTANE ULTRA OP) Apply 1 drop to eye daily as needed. For eyes    . triamcinolone cream (KENALOG) 0.1 % Apply 1 application topically 2 (two) times  daily as needed.     No current facility-administered medications for this visit.     Review of Systems Review of Systems  Constitutional: Negative.   Respiratory: Negative.   Cardiovascular: Negative.   Gastrointestinal: Negative for abdominal pain, constipation and diarrhea.    Blood pressure (!) 158/72, pulse 66, resp. rate 16, height 5\' 2"  (1.575 m), weight 222 lb (100.7 kg).  Physical Exam Physical Exam  Constitutional: She is oriented to person, place, and time. She appears well-developed and well-nourished.  Cardiovascular: Normal rate, regular rhythm and normal heart sounds.  Pulmonary/Chest: Effort normal and breath sounds normal.  Abdominal: Soft. Normal appearance and bowel sounds are normal. There is no tenderness.    Port sites are clean and healing well.   Neurological: She is alert and oriented to person, place, and time.  Skin: Skin is warm.    Data Reviewed May 29, 2017:   APPENDIX; APPENDECTOMY WITH RESECTION OF BASE OF CECUM:  - TUBULAR ADENOMA MEASURING 0.6 CM ARISING IN PROXIMAL APPENDIX.  - INKED PROXIMAL RESECTION MARGIN NEGATIVE FOR DYSPLASIA AND MALIGNANCY.  - ADDPENDIX WITH LYMPHOID HYPERPLASIA.  - NEGATIVE FOR HIGH GRADE DYSPLASIA AND MALIGNANCY.   Assessment    Single benign tubular adenoma identified on recent colonoscopy, resected laparoscopically.    Plan  Patient to return as needed. The patient is aware to call back for any questions or concerns. May return to work as planned 06-12-17  Would anticipate a repeat colonoscopy in 5 years to be arranged with the GI service.  HPI, Physical Exam, Assessment and Plan have been scribed under the direction and in the presence of Donnalee Curry, MD.  Ples Specter, CMA]  I have completed the exam and reviewed the above documentation for accuracy and completeness.  I agree with the above.  Museum/gallery conservator has been used and any errors in dictation or transcription are  unintentional.  Donnalee Curry, M.D., F.A.C.S.  Merrily Pew Tarius Stangelo 06/10/2017, 9:36 AM

## 2017-06-09 NOTE — Telephone Encounter (Signed)
Pt is aware and will be getting her blood work done by her PCP she stated that she had forgot that she had told [facility] that she was getting the blood wok done at her next appt with her pcp.

## 2017-06-09 NOTE — Telephone Encounter (Signed)
There are no labs as far as I can see.  She told me during her annual that she was going to be having some bloodwork done by her PCP in the next 1-2 month, so she would have her labs done then.

## 2017-07-07 DIAGNOSIS — J301 Allergic rhinitis due to pollen: Secondary | ICD-10-CM | POA: Diagnosis not present

## 2017-07-17 DIAGNOSIS — Z9010 Acquired absence of unspecified breast and nipple: Secondary | ICD-10-CM | POA: Diagnosis not present

## 2017-07-17 DIAGNOSIS — Z0000 Encounter for general adult medical examination without abnormal findings: Secondary | ICD-10-CM | POA: Diagnosis not present

## 2017-07-18 DIAGNOSIS — M79606 Pain in leg, unspecified: Secondary | ICD-10-CM | POA: Diagnosis not present

## 2017-07-19 DIAGNOSIS — J301 Allergic rhinitis due to pollen: Secondary | ICD-10-CM | POA: Diagnosis not present

## 2017-07-31 MED ORDER — ATORVASTATIN CALCIUM 40 MG PO TABS
40 MG | ORAL_TABLET | ORAL | 1 refills | Status: DC
Start: 2017-07-31 — End: 2018-08-24

## 2017-07-31 NOTE — Telephone Encounter (Signed)
Labs done 03/2017.

## 2017-08-08 ENCOUNTER — Encounter (INDEPENDENT_AMBULATORY_CARE_PROVIDER_SITE_OTHER): Payer: Self-pay | Admitting: Vascular Surgery

## 2017-08-08 ENCOUNTER — Ambulatory Visit (INDEPENDENT_AMBULATORY_CARE_PROVIDER_SITE_OTHER): Payer: 59 | Admitting: Vascular Surgery

## 2017-08-08 VITALS — BP 160/68 | HR 60 | Resp 17 | Ht 62.0 in | Wt 234.0 lb

## 2017-08-08 DIAGNOSIS — I10 Essential (primary) hypertension: Secondary | ICD-10-CM

## 2017-08-08 DIAGNOSIS — I739 Peripheral vascular disease, unspecified: Secondary | ICD-10-CM

## 2017-08-08 DIAGNOSIS — R600 Localized edema: Secondary | ICD-10-CM

## 2017-08-08 DIAGNOSIS — I872 Venous insufficiency (chronic) (peripheral): Secondary | ICD-10-CM | POA: Insufficient documentation

## 2017-08-08 NOTE — Progress Notes (Signed)
Subjective:    Patient ID: Tammy Webb, female    DOB: 10/08/1955, 62 y.o.   MRN: [id number] Chief Complaint  Patient presents with  . New Patient (Initial Visit)    ref [facility]oud for varicose veins   Presents as a new patient referred by Dr. Juel Burrow for evaluation of varicose veins.  The patient notes a long-standing history of varicosities located to the bilateral legs.  The patient experiences swelling to the bilateral lower extremity.  This is associated with some discomfort.  The patient does note pain that radiates up and down both legs.  The patient also experiences rather painful charley horses to the lower extremity as well.  The patient underwent a bilateral ABI conducted on July 18, 2017 at outside facility which was notable for right ABI 1.07 left ABI 0.93.  At this time, the patient denies any rest pain or ulceration to the bilateral lower extremity.  The patient denies any recent surgery/trauma to the bilateral lower extremity.  The patient did undergo injections to the bilateral knees for pain in the past.  The patient does not engage in conservative therapy at this time including wearing medical grade 1 compression socks and elevating her legs.  The patient denies any fever, nausea vomiting.  Review of Systems  Constitutional: Negative.   HENT: Negative.   Eyes: Negative.   Respiratory: Negative.   Cardiovascular: Positive for leg swelling.       Lower extremity pain Muscle cramping  Gastrointestinal: Negative.   Endocrine: Negative.   Genitourinary: Negative.   Musculoskeletal: Negative.   Skin: Negative.   Allergic/Immunologic: Negative.   Neurological: Negative.   Hematological: Negative.   Psychiatric/Behavioral: Negative.       Objective:   Physical Exam  Constitutional: She is oriented to person, place, and time. She appears well-developed and well-nourished. No distress.  HENT:  Head: Normocephalic and atraumatic.  Right Ear: External ear normal.  Left Ear:  External ear normal.  Mouth/Throat: Oropharynx is clear and moist.  Eyes: Pupils are equal, round, and reactive to light. Conjunctivae and EOM are normal.  Neck: Normal range of motion.  Cardiovascular: Normal rate, regular rhythm, normal heart sounds and intact distal pulses.  Pulses:      Radial pulses are 2+ on the right side, and 2+ on the left side.  Hard to palpate pedal pulses due to edema body habitus however the bilateral feet are warm.  Pulmonary/Chest: Effort normal.  Musculoskeletal: Normal range of motion. She exhibits edema (Moderate nonpitting edema noted bilaterally).  Neurological: She is alert and oriented to person, place, and time.  Skin: She is not diaphoretic.  Greater than 1 cm less than 1 cm scattered varicosities noted to the bilateral lower extremity.  There is no stasis dermatitis, fibrosis, cellulitis or open ulcerations noted to the bilateral lower extremity  Psychiatric: She has a normal mood and affect. Her behavior is normal. Judgment and thought content normal.  Vitals reviewed.  BP (!) 160/68 (BP Location: Right Arm)   Pulse 60   Resp 17   Ht 5\' 2"  (1.575 m)   Wt 234 lb (106.1 kg)   BMI 42.80 kg/m   Past Medical History:  Diagnosis Date  . Arthritis    knees  . Cancer (HCC) 07/2005   Left Breast   . GERD (gastroesophageal reflux disease)   . Hypertension   . Sleep apnea    cpap   Social History   Socioeconomic History  . Marital status: Divorced  Spouse name: Not on file  . Number of children: Not on file  . Years of education: Not on file  . Highest education level: Not on file  Occupational History  . Not on file  Social Needs  . Financial resource strain: Not on file  . Food insecurity:    Worry: Not on file    Inability: Not on file  . Transportation needs:    Medical: Not on file    Non-medical: Not on file  Tobacco Use  . Smoking status: Never Smoker  . Smokeless tobacco: Never Used  Substance and Sexual Activity  .  Alcohol use: No    Alcohol/week: 0.0 oz    Comment: occasionally  . Drug use: No  . Sexual activity: Not Currently    Birth control/protection: Post-menopausal  Lifestyle  . Physical activity:    Days per week: Not on file    Minutes per session: Not on file  . Stress: Not on file  Relationships  . Social connections:    Talks on phone: Not on file    Gets together: Not on file    Attends religious service: Not on file    Active member of club or organization: Not on file    Attends meetings of clubs or organizations: Not on file    Relationship status: Not on file  . Intimate partner violence:    Fear of current or ex partner: Not on file    Emotionally abused: Not on file    Physically abused: Not on file    Forced sexual activity: Not on file  Other Topics Concern  . Not on file  Social History Narrative  . Not on file   Past Surgical History:  Procedure Laterality Date  . BREAST SURGERY Bilateral 12/2005   Mastectomies with reconstruction  . CESAREAN SECTION  1993  . COLONOSCOPY  2015  . COLONOSCOPY WITH PROPOFOL N/A 05/02/2017   Procedure: COLONOSCOPY WITH PROPOFOL;  Surgeon: Midge Minium, MD;  Location: [facility] ENDOSCOPY;  Service: Endoscopy;  Laterality: N/A;  . LAPAROSCOPIC APPENDECTOMY N/A 05/29/2017   Procedure: APPENDECTOMY LAPAROSCOPIC;  Surgeon: Earline Mayotte, MD;  Location: [facility] ORS;  Service: General;  Laterality: N/A;  . TONSILLECTOMY  1980   Family History  Problem Relation Age of Onset  . Breast cancer Maternal Aunt   . Ovarian cancer Paternal Aunt    Allergies  Allergen Reactions  . Morphine And Related Rash    Patient not sure of where reaction came from  . Seasonal Ic [Cholestatin] Other (See Comments)    Runny nose, etc  . Triamcinolone Acetonide Hives    Patient unaware of this reaction      Assessment & Plan:  Presents as a new patient referred by Dr. Juel Burrow for evaluation of varicose veins.  The patient notes a long-standing history of  varicosities located to the bilateral legs.  The patient experiences swelling to the bilateral lower extremity.  This is associated with some discomfort.  The patient does note pain that radiates up and down both legs.  The patient also experiences rather painful charley horses to the lower extremity as well.  The patient underwent a bilateral ABI conducted on July 18, 2017 at outside facility which was notable for right ABI 1.07 left ABI 0.93.  At this time, the patient denies any rest pain or ulceration to the bilateral lower extremity.  The patient denies any recent surgery/trauma to the bilateral lower extremity.  The patient did undergo injections  to the bilateral knees for pain in the past.  The patient does not engage in conservative therapy at this time including wearing medical grade 1 compression socks and elevating her legs.  The patient denies any fever, nausea vomiting.  1. PAD (peripheral artery disease) (HCC) - New Patient underwent an ABI at an outside facility on July 18, 2017 at outside facility which was notable for right ABI 1.07 left ABI 0.93.  I do not feel the patient's discomfort is coming from a peripheral arterial insufficiency. The patient should repeat an ABI every 2 to 3 years.  2. Bilateral lower extremity edema - New The patient was encouraged to wear graduated compression stockings (20-30 mmHg) on a daily basis. The patient was instructed to begin wearing the stockings first thing in the morning and removing them in the evening. The patient was instructed specifically not to sleep in the stockings. Prescription given. In addition, behavioral modification including elevation during the day will be initiated. Anti-inflammatories for pain. Conventional compression socks and Farrow wraps were discussed.  The patient will follow up in one months to asses conservative management.  Information on compression stockings was given to the patient. The patient was instructed to  call the office in the interim if any worsening edema or ulcerations to the legs, feet or toes occurs. The patient expresses their understanding.  - VAS [place] LOWER EXTREMITY VENOUS REFLUX; Future  3. Essential hypertension - Stable Encouraged good control as its slows the progression of atherosclerotic disease  Current Outpatient Medications on File Prior to Visit  Medication Sig Dispense Refill  . bacitracin 500 UNIT/GM ointment Apply 1 application topically daily as needed for wound care.    . Calcium Carbonate-Vit D-Min (CALTRATE 600+D PLUS PO) Take 1 tablet by mouth once a week. IN THE EVENING    . Coenzyme Q10 50 MG CAPS Take 50 mg by mouth every evening.     . Dermatological Products, Misc. (HPR PLUS) CREA Apply 1 Dose topically as needed. For skin irritation    . desonide (VERDESO) 0.05 % foam Apply 1 application topically daily.     . diphenhydrAMINE (BENADRYL) 25 MG tablet Take 25 mg by mouth every 8 (eight) hours as needed (FOR ALLERGIES.).     Marland Kitchen Elastic Bandages & Supports (ABDOMINAL BINDER/ELASTIC LARGE) MISC 1 device to use daily 1 each 0  . EPIPEN 2-PAK 0.3 MG/0.3ML SOAJ injection Inject 0.3 mg into the muscle daily as needed (FOR ALLERGIC REACTIONS.).     Marland Kitchen Fluticasone Propionate 93 MCG/ACT EXHU Place 1 Dose into the nose as needed.    . Hyprom-Naphaz-Polysorb-Zn Sulf (CLEAR EYES COMPLETE OP) Apply 1 drop to eye. As needed for eyes    . ibuprofen (ADVIL,MOTRIN) 200 MG tablet Take 400 mg by mouth every 8 (eight) hours as needed (for pain.).     Valerie Salts Polysacch (ALCORTIN A) 1-2-1 % GEL Apply 1 Dose topically as needed (for irritation).    Marland Kitchen loratadine (CLARITIN) 10 MG tablet Take 10 mg by mouth every Saturday. In the morning.    Marland Kitchen losartan (COZAAR) 100 MG tablet Take 100 mg by mouth every evening.     . Misc Natural Products (OSTEO BI-FLEX ADV TRIPLE ST PO) Take 1 tablet by mouth daily.     . mometasone (ELOCON) 0.1 % lotion Apply 1 Dose topically daily as needed.  For ears    . Multiple Vitamins-Minerals (MULTIVITAMIN WITH MINERALS) tablet Take 1 tablet by mouth daily. Centrum Silver    .  nystatin cream (MYCOSTATIN) Apply 1 application topically 2 (two) times daily as needed (for dry skin/skin fungus.).     Marland Kitchen Omega-3 Fatty Acids (OMEGA 3 PO) Take 1 capsule by mouth once a week. Complete Omega    . phentermine (ADIPEX-P) 37.5 MG tablet Take 18.75 mg by mouth daily before breakfast.   1  . Polyethyl Glycol-Propyl Glycol (SYSTANE ULTRA OP) Apply 1 drop to eye daily as needed. For eyes    . triamcinolone cream (KENALOG) 0.1 % Apply 1 application topically 2 (two) times daily as needed.     No current facility-administered medications on file prior to visit.    There are no Patient Instructions on file for this visit. No follow-ups on file.  Jehan Ranganathan A Dimitrios Balestrieri, PA-C

## 2017-10-06 ENCOUNTER — Encounter (INDEPENDENT_AMBULATORY_CARE_PROVIDER_SITE_OTHER): Payer: 59

## 2017-10-06 ENCOUNTER — Ambulatory Visit (INDEPENDENT_AMBULATORY_CARE_PROVIDER_SITE_OTHER): Payer: 59 | Admitting: Vascular Surgery

## 2017-10-20 DIAGNOSIS — J301 Allergic rhinitis due to pollen: Secondary | ICD-10-CM | POA: Diagnosis not present

## 2017-10-26 DIAGNOSIS — J301 Allergic rhinitis due to pollen: Secondary | ICD-10-CM | POA: Diagnosis not present

## 2017-11-23 MED ORDER — METOPROLOL SUCCINATE ER 25 MG PO TB24
25 MG | ORAL_TABLET | ORAL | 3 refills | Status: DC
Start: 2017-11-23 — End: 2019-02-19

## 2017-11-23 NOTE — Telephone Encounter (Signed)
Labs current.

## 2017-12-20 ENCOUNTER — Encounter

## 2017-12-20 ENCOUNTER — Encounter (INDEPENDENT_AMBULATORY_CARE_PROVIDER_SITE_OTHER): Payer: Self-pay | Admitting: Vascular Surgery

## 2017-12-20 ENCOUNTER — Ambulatory Visit (INDEPENDENT_AMBULATORY_CARE_PROVIDER_SITE_OTHER): Payer: 59

## 2017-12-20 ENCOUNTER — Ambulatory Visit (INDEPENDENT_AMBULATORY_CARE_PROVIDER_SITE_OTHER): Payer: 59 | Admitting: Vascular Surgery

## 2017-12-20 VITALS — BP 143/74 | HR 58 | Resp 17 | Ht 62.5 in | Wt 241.0 lb

## 2017-12-20 DIAGNOSIS — I890 Lymphedema, not elsewhere classified: Secondary | ICD-10-CM | POA: Diagnosis not present

## 2017-12-20 DIAGNOSIS — I872 Venous insufficiency (chronic) (peripheral): Secondary | ICD-10-CM | POA: Diagnosis not present

## 2017-12-20 DIAGNOSIS — R600 Localized edema: Secondary | ICD-10-CM | POA: Diagnosis not present

## 2017-12-20 NOTE — Progress Notes (Signed)
Subjective:    Patient ID: Tammy Webb, female    DOB: [birth year], 62 y.o.   MRN: [id number] Chief Complaint  Patient presents with  . Follow-up    1 month Bilateral Venous Reflux   Patient was last seen on Aug 08, 2017 for evaluation of bilateral lower extremity edema and discomfort.  Patient presents today to review vascular studies.  Since her initial visit, the patient has been engaging conservative therapy including wearing medical grade 1 compression socks, elevating her legs and remaining as active as possible on a daily basis.  The patient notes that this has provided minimal improvement to her symptoms.  The patient still experiences significant bilateral lower extremity edema and discomfort.  The patient feels that her symptoms have progressed to the point that she is unable to function on a daily basis and they have become lifestyle limiting.  The patient denies any bouts of cellulitis or ulcer formation to the bilateral legs.  The patient underwent a bilateral lower extremity venous reflux study which was notable for Right: reflux in the great femoral vein, saphenofemoral junction and mid great saphenous vein. Left: No evidence of reflux to the extremity.  No DVT or SVT bilaterally.  Denies any fever, nausea vomiting.  Review of Systems  Constitutional: Negative.   HENT: Negative.   Eyes: Negative.   Respiratory: Negative.   Cardiovascular: Positive for leg swelling.  Gastrointestinal: Negative.   Endocrine: Negative.   Genitourinary: Negative.   Musculoskeletal: Negative.   Skin: Negative.   Allergic/Immunologic: Negative.   Neurological: Negative.   Hematological: Negative.   Psychiatric/Behavioral: Negative.       Objective:   Physical Exam  Constitutional: She is oriented to person, place, and time. She appears well-developed and well-nourished. No distress.  HENT:  Head: Normocephalic and atraumatic.  Right Ear: External ear normal.  Left Ear: External ear normal.   Eyes: Pupils are equal, round, and reactive to light. Conjunctivae and EOM are normal.  Neck: Normal range of motion.  Cardiovascular: Normal rate, regular rhythm, normal heart sounds and intact distal pulses.  Pulses:      Radial pulses are 2+ on the right side, and 2+ on the left side.       Dorsalis pedis pulses are 1+ on the right side, and 1+ on the left side.       Posterior tibial pulses are 1+ on the right side, and 1+ on the left side.  Pulmonary/Chest: Effort normal and breath sounds normal.  Musculoskeletal: Normal range of motion. She exhibits edema (Mild to moderate nonpitting bilateral lower extremity edema).  Neurological: She is alert and oriented to person, place, and time.  Skin: Skin is warm and dry. She is not diaphoretic.  Psychiatric: She has a normal mood and affect. Her behavior is normal. Judgment and thought content normal.  Vitals reviewed.  BP (!) 143/74 (BP Location: Right Arm, Patient Position: Sitting)   Pulse (!) 58   Resp 17   Ht 5' 2.5" (1.588 m)   Wt 241 lb (109.3 kg)   BMI 43.38 kg/m   Past Medical History:  Diagnosis Date  . Arthritis    knees  . Cancer (HCC) 07/2005   Left Breast   . GERD (gastroesophageal reflux disease)   . Hypertension   . Sleep apnea    cpap   Social History   Socioeconomic History  . Marital status: Divorced    Spouse name: Not on file  . Number of children: Not  on file  . Years of education: Not on file  . Highest education level: Not on file  Occupational History  . Not on file  Social Needs  . Financial resource strain: Not on file  . Food insecurity:    Worry: Not on file    Inability: Not on file  . Transportation needs:    Medical: Not on file    Non-medical: Not on file  Tobacco Use  . Smoking status: Never Smoker  . Smokeless tobacco: Never Used  Substance and Sexual Activity  . Alcohol use: No    Alcohol/week: 0.0 standard drinks    Comment: occasionally  . Drug use: No  . Sexual  activity: Not Currently    Birth control/protection: Post-menopausal  Lifestyle  . Physical activity:    Days per week: Not on file    Minutes per session: Not on file  . Stress: Not on file  Relationships  . Social connections:    Talks on phone: Not on file    Gets together: Not on file    Attends religious service: Not on file    Active member of club or organization: Not on file    Attends meetings of clubs or organizations: Not on file    Relationship status: Not on file  . Intimate partner violence:    Fear of current or ex partner: Not on file    Emotionally abused: Not on file    Physically abused: Not on file    Forced sexual activity: Not on file  Other Topics Concern  . Not on file  Social History Narrative  . Not on file   Past Surgical History:  Procedure Laterality Date  . BREAST SURGERY Bilateral 12/2005   Mastectomies with reconstruction  . CESAREAN SECTION  1993  . COLONOSCOPY  2015  . COLONOSCOPY WITH PROPOFOL N/A 05/02/2017   Procedure: COLONOSCOPY WITH PROPOFOL;  Surgeon: Midge Minium, MD;  Location: [facility] ENDOSCOPY;  Service: Endoscopy;  Laterality: N/A;  . LAPAROSCOPIC APPENDECTOMY N/A 05/29/2017   Procedure: APPENDECTOMY LAPAROSCOPIC;  Surgeon: Earline Mayotte, MD;  Location: [facility] ORS;  Service: General;  Laterality: N/A;  . TONSILLECTOMY  1980   Family History  Problem Relation Age of Onset  . Breast cancer Maternal Aunt   . Ovarian cancer Paternal Aunt    Allergies  Allergen Reactions  . Morphine And Related Rash    Patient not sure of where reaction came from  . Seasonal Ic [Cholestatin] Other (See Comments)    Runny nose, etc  . Triamcinolone Acetonide Hives    Patient unaware of this reaction      Assessment & Plan:   Patient was last seen on Aug 08, 2017 for evaluation of bilateral lower extremity edema and discomfort.  Patient presents today to review vascular studies.  Since her initial visit, the patient has been engaging  conservative therapy including wearing medical grade 1 compression socks, elevating her legs and remaining as active as possible on a daily basis.  The patient notes that this has provided minimal improvement to her symptoms.  The patient still experiences significant bilateral lower extremity edema and discomfort.  The patient feels that her symptoms have progressed to the point that she is unable to function on a daily basis and they have become lifestyle limiting.  The patient denies any bouts of cellulitis or ulcer formation to the bilateral legs.  The patient underwent a bilateral lower extremity venous reflux study which was notable for Right: reflux  in the great femoral vein, saphenofemoral junction and mid great saphenous vein. Left: No evidence of reflux to the extremity.  No DVT or SVT bilaterally.  Denies any fever, nausea vomiting.  1. Lymphedema - New Despite conservative treatments including exercise, elevation and class I compression socks, the patient still presents with stage I lymphedema. The patient would greatly benefit from the added therapy of a lymphedema pump the patient's symptoms have progressed to the point that they have become lifestyle limiting. I will applied to the patient's insurance in the meantime she is to continue engaging in conservative therapy  2. Chronic venous insufficiency - New Due to the location of the patient's venous insufficiency to the right lower extremity being in the deep system and a very small length of the great saphenous vein she is not a candidate for endovenous laser ablation.  Current Outpatient Medications on File Prior to Visit  Medication Sig Dispense Refill  . bacitracin 500 UNIT/GM ointment Apply 1 application topically daily as needed for wound care.    . Calcium Carbonate-Vit D-Min (CALTRATE 600+D PLUS PO) Take 1 tablet by mouth once a week. IN THE EVENING    . Coenzyme Q10 50 MG CAPS Take 50 mg by mouth every evening.     .  Dermatological Products, Misc. (HPR PLUS) CREA Apply 1 Dose topically as needed. For skin irritation    . desonide (VERDESO) 0.05 % foam Apply 1 application topically daily.     . diphenhydrAMINE (BENADRYL) 25 MG tablet Take 25 mg by mouth every 8 (eight) hours as needed (FOR ALLERGIES.).     Marland Kitchen Elastic Bandages & Supports (ABDOMINAL BINDER/ELASTIC LARGE) MISC 1 device to use daily 1 each 0  . EPIPEN 2-PAK 0.3 MG/0.3ML SOAJ injection Inject 0.3 mg into the muscle daily as needed (FOR ALLERGIC REACTIONS.).     Marland Kitchen Fluticasone Propionate 93 MCG/ACT EXHU Place 1 Dose into the nose as needed.    . Fluticasone-Salmeterol (ADVAIR) 250-50 MCG/DOSE AEPB     . Hyprom-Naphaz-Polysorb-Zn Sulf (CLEAR EYES COMPLETE OP) Apply 1 drop to eye. As needed for eyes    . ibuprofen (ADVIL,MOTRIN) 200 MG tablet Take 400 mg by mouth every 8 (eight) hours as needed (for pain.).     Valerie Salts Polysacch (ALCORTIN A) 1-2-1 % GEL Apply 1 Dose topically as needed (for irritation).    Marland Kitchen loratadine (CLARITIN) 10 MG tablet Take 10 mg by mouth every Saturday. In the morning.    Marland Kitchen losartan (COZAAR) 100 MG tablet Take 100 mg by mouth every evening.     . Misc Natural Products (OSTEO BI-FLEX ADV TRIPLE ST PO) Take 1 tablet by mouth daily.     . mometasone (ELOCON) 0.1 % lotion Apply 1 Dose topically daily as needed. For ears    . Multiple Vitamins-Minerals (MULTIVITAMIN WITH MINERALS) tablet Take 1 tablet by mouth daily. Centrum Silver    . nystatin cream (MYCOSTATIN) Apply 1 application topically 2 (two) times daily as needed (for dry skin/skin fungus.).     Marland Kitchen Omega-3 Fatty Acids (OMEGA 3 PO) Take 1 capsule by mouth once a week. Complete Omega    . phentermine (ADIPEX-P) 37.5 MG tablet Take 18.75 mg by mouth daily before breakfast.   1  . Polyethyl Glycol-Propyl Glycol (SYSTANE ULTRA OP) Apply 1 drop to eye daily as needed. For eyes    . triamcinolone cream (KENALOG) 0.1 % Apply 1 application topically 2 (two) times daily as  needed.     No current  facility-administered medications on file prior to visit.    There are no Patient Instructions on file for this visit. No follow-ups on file.  Winford Hehn A Ferrin Liebig, PA-C

## 2017-12-25 DIAGNOSIS — E669 Obesity, unspecified: Secondary | ICD-10-CM | POA: Diagnosis not present

## 2017-12-25 DIAGNOSIS — M79606 Pain in leg, unspecified: Secondary | ICD-10-CM | POA: Diagnosis not present

## 2017-12-25 DIAGNOSIS — Z0000 Encounter for general adult medical examination without abnormal findings: Secondary | ICD-10-CM | POA: Diagnosis not present

## 2017-12-25 DIAGNOSIS — Z9010 Acquired absence of unspecified breast and nipple: Secondary | ICD-10-CM | POA: Diagnosis not present

## 2018-01-10 DIAGNOSIS — J309 Allergic rhinitis, unspecified: Secondary | ICD-10-CM | POA: Diagnosis not present

## 2018-02-02 DEATH — deceased

## 2018-02-07 DIAGNOSIS — J301 Allergic rhinitis due to pollen: Secondary | ICD-10-CM | POA: Diagnosis not present

## 2018-02-12 DIAGNOSIS — I890 Lymphedema, not elsewhere classified: Secondary | ICD-10-CM | POA: Diagnosis not present

## 2018-02-12 DIAGNOSIS — J301 Allergic rhinitis due to pollen: Secondary | ICD-10-CM | POA: Diagnosis not present

## 2018-03-06 ENCOUNTER — Encounter (INDEPENDENT_AMBULATORY_CARE_PROVIDER_SITE_OTHER): Payer: 59

## 2018-03-06 ENCOUNTER — Ambulatory Visit (INDEPENDENT_AMBULATORY_CARE_PROVIDER_SITE_OTHER): Payer: 59 | Admitting: Vascular Surgery

## 2018-03-06 ENCOUNTER — Encounter

## 2018-03-08 DIAGNOSIS — L304 Erythema intertrigo: Secondary | ICD-10-CM | POA: Diagnosis not present

## 2018-03-08 DIAGNOSIS — L209 Atopic dermatitis, unspecified: Secondary | ICD-10-CM | POA: Diagnosis not present

## 2018-03-08 DIAGNOSIS — L719 Rosacea, unspecified: Secondary | ICD-10-CM | POA: Diagnosis not present

## 2018-05-21 ENCOUNTER — Ambulatory Visit
Admit: 2018-05-21 | Discharge: 2018-05-21 | Payer: PRIVATE HEALTH INSURANCE | Attending: Cardiovascular Disease | Primary: Family Medicine

## 2018-05-21 DIAGNOSIS — I10 Essential (primary) hypertension: Secondary | ICD-10-CM

## 2018-05-21 MED ORDER — NITROGLYCERIN 0.4 MG SL SUBL
0.4 MG | ORAL_TABLET | SUBLINGUAL | 3 refills | Status: AC | PRN
Start: 2018-05-21 — End: ?

## 2018-05-21 NOTE — Progress Notes (Signed)
[facility] Cardiology Office Note    DATE of SERVICE: 05/21/18    TIME of SERVICE: 9:38 AM                   Reason for Visit:  Chief Complaint   Patient presents with   ??? 1 Year Follow Up        History of Present Illness:   Sally Wong is a 63 y.o. female who returns for follow-up of coronary artery disease status post DES to the left anterior descending in 2010.  She is doing well.  She denies chest discomfort or shortness of breath.      12-lead EKG, which I personally reviewed today, shows sinus rhythm without concerning changes.    Past Medical History:  Past Medical History:   Diagnosis Date   ??? Atherosclerotic heart disease of native coronary artery without angina pectoris    ??? Chest pain, unspecified    ??? Essential (primary) hypertension    ??? Family history of ischemic heart disease and other diseases of the circulatory system    ??? GERD (gastroesophageal reflux disease)    ??? Hyperlipidemia    ??? Presence of coronary angioplasty implant and graft         Past Surgical History  Past Surgical History:   Procedure Laterality Date   ??? CORONARY ANGIOPLASTY WITH STENT PLACEMENT  01/2009    PCI DES LAD   ??? HYSTERECTOMY, TOTAL ABDOMINAL         Family History  Family History   Problem Relation Age of Onset   ??? Heart Attack Father         myocardial infarction at less than 33        Social History  Social History     Tobacco Use   ??? Smoking status: Never Smoker   ??? Smokeless tobacco: Never Used   Substance Use Topics   ??? Alcohol use: No   ??? Drug use: No        Medications:    Current Outpatient Medications:   ???  metoprolol succinate (TOPROL XL) 25 MG extended release tablet, TAKE 1 TABLET BY MOUTH EVERY DAY, Disp: 90 tablet, Rfl: 3  ???  atorvastatin (LIPITOR) 40 MG tablet, TAKE 1 TABLET BY MOUTH EVERY OTHER DAY, Disp: 90 tablet, Rfl: 1  ???  aspirin 81 MG EC tablet, Take 81 mg by mouth daily Take As Directed, Disp: , Rfl:   ???  nitroGLYCERIN (NITROSTAT) 0.4 MG SL tablet, Place 1 tablet under the tongue every 5 minutes  as needed for Chest pain Dissolve 1 tab under tongue at first sign of chest pain. May repeat every 5 minutes until relief is obtained. If pain persists after taking 3 tabs in a 15-minute period,  call 9-1-1 immediately., Disp: 25 tablet, Rfl: 3  ???  sodium chloride (SALINE MIST) 0.65 % nasal spray, 1 spray by Nasal route as needed for Congestion Take As Directed, Disp: , Rfl:     Review of Systems:  Review of Systems   Constitutional: Negative for diaphoresis, fatigue and fever.   HENT: Negative for congestion and nosebleeds.    Eyes: Negative for visual disturbance.   Respiratory: Negative for cough, shortness of breath and wheezing.    Cardiovascular: Negative for chest pain, palpitations and leg swelling.   Gastrointestinal: Negative for abdominal pain, blood in stool, nausea and vomiting.   Genitourinary: Negative for hematuria.   Musculoskeletal: Positive for myalgias. Negative for arthralgias.  Skin: Negative for rash.   Neurological: Negative for dizziness and syncope.   Hematological: Does not bruise/bleed easily.   Psychiatric/Behavioral: Negative for dysphoric mood and suicidal ideas. The patient is not nervous/anxious.         Denies Depression       Physical Examination:  Vitals: Blood pressure 124/82, pulse 80, resp. rate 16, height 5' (1.524 m), weight 171 lb (77.6 kg). Body mass index is 33.4 kg/m??.  Physical Exam   Constitutional: She is oriented to person, place, and time. She appears well-developed and well-nourished.   HENT:   Head: Normocephalic.   Eyes: Conjunctivae are normal. No scleral icterus.   Neck: No JVD present.   Cardiovascular: Regular rhythm, S1 normal, S2 normal and intact distal pulses. PMI is not displaced. Exam reveals no S3 and no S4.   No murmur heard.  Pulmonary/Chest: Breath sounds normal. She has no wheezes. She has no rales.   Abdominal: Soft. Bowel sounds are normal. There is no abdominal tenderness.   Musculoskeletal:         General: No edema.   Neurological: She is  alert and oriented to person, place, and time.   Skin: Skin is warm and dry. No rash noted.   Psychiatric: She has a normal mood and affect.       Laboratory Tests:       Cardiac Tests:    Echo (date: 12/14/2010):  Normal stress echo    Cardiac Catheterization (date:  01/19/2009): Single-vessel disease, successful DES to the mid left anterior descending          Assessment and Plan:  1.  Coronary artery disease.  She is status post left anterior descending stenting in 2010.  She has no angina pectoris.  Continue aggressive secondary prevention measures.  She developed myalgias on daily atorvastatin.  These improved by taking the medicine every other day.  Her numbers are excellent as above.         Hines Kloss A. Milinda Antis, MD, [facility]

## 2018-06-07 NOTE — Telephone Encounter (Signed)
CMP and lipids entered into Epic.

## 2018-06-19 ENCOUNTER — Other Ambulatory Visit: Payer: Self-pay

## 2018-06-19 ENCOUNTER — Ambulatory Visit (INDEPENDENT_AMBULATORY_CARE_PROVIDER_SITE_OTHER): Payer: Managed Care, Other (non HMO) | Admitting: Obstetrics and Gynecology

## 2018-06-19 ENCOUNTER — Encounter: Payer: Self-pay | Admitting: Obstetrics and Gynecology

## 2018-06-19 VITALS — BP 146/83 | HR 69 | Ht 62.5 in | Wt 254.9 lb

## 2018-06-19 DIAGNOSIS — Z6841 Body Mass Index (BMI) 40.0 and over, adult: Secondary | ICD-10-CM

## 2018-06-19 DIAGNOSIS — N952 Postmenopausal atrophic vaginitis: Secondary | ICD-10-CM

## 2018-06-19 DIAGNOSIS — N950 Postmenopausal bleeding: Secondary | ICD-10-CM

## 2018-06-19 DIAGNOSIS — Z01419 Encounter for gynecological examination (general) (routine) without abnormal findings: Secondary | ICD-10-CM | POA: Diagnosis not present

## 2018-06-19 DIAGNOSIS — I10 Essential (primary) hypertension: Secondary | ICD-10-CM

## 2018-06-19 DIAGNOSIS — Z853 Personal history of malignant neoplasm of breast: Secondary | ICD-10-CM

## 2018-06-19 NOTE — Progress Notes (Signed)
ANNUAL PREVENTATIVE CARE GYNECOLOGY  ENCOUNTER NOTE  Subjective:       Tammy Webb is a 63 y.o. G35P1020 female here for a routine annual gynecologic exam.  Patient has a PMH of left breast cancer, s/p bilateral mastectomy (2007) with reconstruction and implants. The patient is not currently sexually active. The patient has never taken hormone replacement therapy.  The patient wears seatbelts: yes. The patient participates in regular exercise: no. Has the patient ever been transfused or tattooed?: no. The patient reports that there is not domestic violence in her life.  Current complaints: 1.  Patient reports an episode of bleeding x 1, approximately 3-4 weeks ago. She notes she got a steroid shot for her shoulders and several days later noticed vaginal bleeding.  Bleeding lasted  Would like her hormone levels checked.    Gynecologic History No LMP recorded. Patient is postmenopausal. Contraception: post menopausal status Last Pap: 05/25/2016 . Results were: normal Last mammogram: ~2007. S/p bilateral mastectomy for breast cancer Last Colonoscopy:  05/02/2017.  H/o colon polyps.  Recommend repeat in 5 years.   Obstetric History OB History  Gravida Para Term Preterm AB Living  3 1 1   2     SAB TAB Ectopic Multiple Live Births  1            # Outcome Date GA Lbr Len/2nd Weight Sex Delivery Anes PTL Lv  3 Term 1993   8 lb 1.8 oz (3.679 kg) F CS-Unspec     2 AB           1 SAB             Past Medical History:  Diagnosis Date  . Arthritis    knees  . Cancer ([place]) 07/2005   Left Breast   . GERD (gastroesophageal reflux disease)   . Hypertension   . Sleep apnea    cpap    Family History  Problem Relation Age of Onset  . Breast cancer Maternal Aunt   . Ovarian cancer Paternal Aunt     Past Surgical History:  Procedure Laterality Date  . BREAST SURGERY Bilateral 12/2005   Mastectomies with reconstruction  . CESAREAN SECTION  1993  . COLONOSCOPY  2015  . COLONOSCOPY  WITH PROPOFOL N/A 05/02/2017   Procedure: COLONOSCOPY WITH PROPOFOL;  Surgeon: Lucilla Lame, MD;  Location: [facility] ENDOSCOPY;  Service: Endoscopy;  Laterality: N/A;  . LAPAROSCOPIC APPENDECTOMY N/A 05/29/2017   Procedure: APPENDECTOMY LAPAROSCOPIC;  Surgeon: Robert Bellow, MD;  Location: [facility] ORS;  Service: General;  Laterality: N/A;  . TONSILLECTOMY  1980    Social History   Socioeconomic History  . Marital status: Divorced    Spouse name: Not on file  . Number of children: Not on file  . Years of education: Not on file  . Highest education level: Not on file  Occupational History  . Not on file  Social Needs  . Financial resource strain: Not on file  . Food insecurity:    Worry: Not on file    Inability: Not on file  . Transportation needs:    Medical: Not on file    Non-medical: Not on file  Tobacco Use  . Smoking status: Never Smoker  . Smokeless tobacco: Never Used  Substance and Sexual Activity  . Alcohol use: No    Alcohol/week: 0.0 standard drinks    Comment: occasionally  . Drug use: No  . Sexual activity: Not Currently    Birth control/protection: Post-menopausal  Lifestyle  . Physical activity:    Days per week: Not on file    Minutes per session: Not on file  . Stress: Not on file  Relationships  . Social connections:    Talks on phone: Not on file    Gets together: Not on file    Attends religious service: Not on file    Active member of club or organization: Not on file    Attends meetings of clubs or organizations: Not on file    Relationship status: Not on file  . Intimate partner violence:    Fear of current or ex partner: Not on file    Emotionally abused: Not on file    Physically abused: Not on file    Forced sexual activity: Not on file  Other Topics Concern  . Not on file  Social History Narrative  . Not on file    Current Outpatient Medications on File Prior to Visit  Medication Sig Dispense Refill  . bacitracin 500 UNIT/GM ointment  Apply 1 application topically daily as needed for wound care.    . Calcium Carbonate-Vit D-Min (CALTRATE 600+D PLUS PO) Take 1 tablet by mouth once a week. IN THE EVENING    . Coenzyme Q10 50 MG CAPS Take 50 mg by mouth every evening.     . Dermatological Products, Misc. (HPR PLUS) CREA Apply 1 Dose topically as needed. For skin irritation    . desonide (VERDESO) 0.05 % foam Apply 1 application topically daily.     . diphenhydrAMINE (BENADRYL) 25 MG tablet Take 25 mg by mouth every 8 (eight) hours as needed (FOR ALLERGIES.).     Marland Kitchen Elastic Bandages & Supports (ABDOMINAL BINDER/ELASTIC LARGE) MISC 1 device to use daily 1 each 0  . EPIPEN 2-PAK 0.3 MG/0.3ML SOAJ injection Inject 0.3 mg into the muscle daily as needed (FOR ALLERGIC REACTIONS.).     Marland Kitchen Fluticasone Propionate 93 MCG/ACT EXHU Place 1 Dose into the nose as needed.    . Fluticasone-Salmeterol (ADVAIR) 250-50 MCG/DOSE AEPB     . Hyprom-Naphaz-Polysorb-Zn Sulf (CLEAR EYES COMPLETE OP) Apply 1 drop to eye. As needed for eyes    . ibuprofen (ADVIL,MOTRIN) 200 MG tablet Take 400 mg by mouth every 8 (eight) hours as needed (for pain.).     Jerrye Beavers Polysacch (ALCORTIN A) 1-2-1 % GEL Apply 1 Dose topically as needed (for irritation).    Marland Kitchen loratadine (CLARITIN) 10 MG tablet Take 10 mg by mouth every Saturday. In the morning.    Marland Kitchen losartan (COZAAR) 100 MG tablet Take 100 mg by mouth every evening.     . Misc Natural Products (OSTEO BI-FLEX ADV TRIPLE ST PO) Take 1 tablet by mouth daily.     . mometasone (ELOCON) 0.1 % lotion Apply 1 Dose topically daily as needed. For ears    . Multiple Vitamins-Minerals (MULTIVITAMIN WITH MINERALS) tablet Take 1 tablet by mouth daily. Centrum Silver    . nystatin cream (MYCOSTATIN) Apply 1 application topically 2 (two) times daily as needed (for dry skin/skin fungus.).     Marland Kitchen Omega-3 Fatty Acids (OMEGA 3 PO) Take 1 capsule by mouth once a week. Complete Omega    . phentermine (ADIPEX-P) 37.5 MG tablet  Take 18.75 mg by mouth daily before breakfast.   1  . Polyethyl Glycol-Propyl Glycol (SYSTANE ULTRA OP) Apply 1 drop to eye daily as needed. For eyes    . triamcinolone cream (KENALOG) 0.1 % Apply 1 application topically 2 (two) times  daily as needed.     No current facility-administered medications on file prior to visit.     Allergies  Allergen Reactions  . Morphine And Related Rash    Patient not sure of where reaction came from  . Seasonal Ic [Cholestatin] Other (See Comments)    Runny nose, etc  . Triamcinolone Acetonide Hives    Patient unaware of this reaction     Review of Systems ROS Review of Systems - General ROS: negative for - chills, fatigue, fever, hot flashes, night sweats, weight gain or weight loss Psychological ROS: negative for - anxiety, decreased libido, depression, mood swings, physical abuse or sexual abuse Ophthalmic ROS: negative for - blurry vision, eye pain or loss of vision ENT ROS: negative for - headaches, hearing change, visual changes or vocal changes Allergy and Immunology ROS: negative for - hives, itchy/watery eyes or seasonal allergies Hematological and Lymphatic ROS: negative for - bleeding problems, bruising, swollen lymph nodes or weight loss Endocrine ROS: negative for - galactorrhea, hair pattern changes, hot flashes, malaise/lethargy, mood swings, palpitations, polydipsia/polyuria, skin changes, temperature intolerance or unexpected weight changes Breast ROS: negative for - new or changing breast lumps or nipple discharge Respiratory ROS: negative for - cough or shortness of breath Cardiovascular ROS: negative for - chest pain, irregular heartbeat, palpitations or shortness of breath Gastrointestinal ROS: No abdominal pain, change in bowel habits, or black or bloody stools Genito-Urinary ROS: no dysuria, trouble voiding, or hematuria. Positive for vaginal bleeding (see HPI).  Musculoskeletal ROS: negative for - joint pain or joint stiffness  Neurological ROS: negative for - bowel and bladder control changes Dermatological ROS: negative for rash and skin lesion changes   Objective:   BP (!) 146/83   Pulse 69   Ht 5' 2.5" (1.588 m)   Wt 254 lb 14.4 oz (115.6 kg)   BMI 45.88 kg/m  CONSTITUTIONAL: Well-developed, well-nourished female in no acute distress.  PSYCHIATRIC: Normal mood and affect. Normal behavior. Normal judgment and thought content. [place]: Alert and oriented to person, place, and time. Normal muscle tone coordination. No cranial nerve deficit noted. HENT:  Normocephalic, atraumatic, External right and left ear normal. Oropharynx is clear and moist EYES: Conjunctivae and EOM are normal. Pupils are equal, round, and reactive to light. No scleral icterus.  NECK: Normal range of motion, supple, no masses.  Normal thyroid.  SKIN: Skin is warm and dry. No rash noted. Not diaphoretic. No erythema. No pallor. CARDIOVASCULAR: Normal heart rate noted, regular rhythm, no murmur. RESPIRATORY: Clear to auscultation bilaterally. Effort and breath sounds normal, no problems with respiration noted. BREASTS: Bilateral mastectomy scars present, implants palpable, no leakage.  ABDOMEN: Soft, normal bowel sounds, no distention noted.  Mild tenderness at incision sites. No rebound or guarding. Incision dressings clean/dry/intact.  BLADDER: Normal PELVIC:  Bladder no bladder distension noted  Urethra: normal appearing urethra with no masses, tenderness or lesions  Vulva: normal appearing vulva with no masses, tenderness or lesions  Vagina: mildly atrophic, otherwise normal appearing vagina with normal color and discharge, no lesions  Cervix: normal appearing cervix without discharge or lesions  Uterus: uterus is normal size, shape, consistency and nontender  Adnexa: normal adnexa in size, nontender and no masses  RV: External Exam NormaI, No Rectal Masses and Normal Sphincter tone  MUSCULOSKELETAL: Normal range of motion. No  tenderness.  No cyanosis, clubbing, or edema.  2+ distal pulses. LYMPHATIC: No Axillary, Supraclavicular, or Inguinal Adenopathy.    Labs:   Lab Results  Component  Value Date   WBC 8.2 05/23/2017   HGB 13.4 05/23/2017   HCT 38.8 05/23/2017   MCV 89.9 05/23/2017   PLT 293 05/23/2017      Chemistry      Component Value Date/Time   NA 141 05/23/2017 1529   K 3.9 05/23/2017 1529   CL 104 05/23/2017 1529   CO2 29 05/23/2017 1529   BUN 12 05/23/2017 1529   CREATININE 0.72 05/23/2017 1529      Component Value Date/Time   CALCIUM 9.1 05/23/2017 1529      No results found for: TSH  No results found for: CHOL, HDL, LDLCALC, LDLDIRECT, TRIG, CHOLHDL  No results found for: HGBA1C   Assessment:   Annual gynecologic examination 63 y.o. Contraception: post menopausal status Obesity (Class III) H/o breast cancer Vaginal atrophy PMB Essential HTN  Plan:  Pap: Pap Co Test not indicated. Currently up to date.  Mammogram: Not Indicated.  Patient is s/p double mastectomy for h/o breast cancer with bilateral implants and reconstruction.  Unsure if her implants are permanent or if they need to be replaced. Advised to discuss with her General Surgeon.  Stool Guaiac Testing:  Not Indicated.  Patient up to date on colonoscopy. Needs repeat in 5 years due to findings of colon polyps.  Labs: Patient desires hormone levels to be checked. Notes she had her TSH  Routine preventative health maintenance measures emphasized: Exercise/Diet/Weight control, Alcohol/Substance use risks and Stress Management Vaginal atrophy, mild. Unclear if bleeding is from this or  Uterine in nature. Will complete workup, and if negative, can treat vaginal atrophy.  Post-menopasal bleeding. Patient desires hormone levels to be checked to be sure that she is menopausal. Informed that it was not necessary based on her age and absence of menses for years, however patient insists.  Will order. Will also order  ultrasound to assess endometrial lining and rule out malignancy/hyperplasia.  Can perform endometrial biopsy if warranted after results.  Essential Hypertension managed by PCP.  U[ to date on flu vaccine.  Return to [place] for annual exam; in 1-2 weeks for ultrasound and f/u with MD.     Rubie Maid, MD  [facility]

## 2018-06-19 NOTE — Progress Notes (Signed)
PT is present today for her annual exam. Pt stated that she has been doing self-breast exams monthly. Pt stated that she had a cycle about 3-4 weeks ago it lasted for about a week, cycle was middle mild bleeding.  No other problems or concerns.

## 2018-06-19 NOTE — Patient Instructions (Addendum)
Preventive Care 40-64 Years, Female Preventive care refers to lifestyle choices and visits with your health care provider that can promote health and wellness. What does preventive care include?   A yearly physical exam. This is also called an annual well check.  Dental exams once or twice a year.  Routine eye exams. Ask your health care provider how often you should have your eyes checked.  Personal lifestyle choices, including: ? Daily care of your teeth and gums. ? Regular physical activity. ? Eating a healthy diet. ? Avoiding tobacco and drug use. ? Limiting alcohol use. ? Practicing safe sex. ? Taking low-dose aspirin daily starting at age 50. ? Taking vitamin and mineral supplements as recommended by your health care provider. What happens during an annual well check? The services and screenings done by your health care provider during your annual well check will depend on your age, overall health, lifestyle risk factors, and family history of disease. Counseling Your health care provider may ask you questions about your:  Alcohol use.  Tobacco use.  Drug use.  Emotional well-being.  Home and relationship well-being.  Sexual activity.  Eating habits.  Work and work environment.  Method of birth control.  Menstrual cycle.  Pregnancy history. Screening You may have the following tests or measurements:  Height, weight, and BMI.  Blood pressure.  Lipid and cholesterol levels. These may be checked every 5 years, or more frequently if you are over 50 years old.  Skin check.  Lung cancer screening. You may have this screening every year starting at age 55 if you have a 30-pack-year history of smoking and currently smoke or have quit within the past 15 years.  Colorectal cancer screening. All adults should have this screening starting at age 50 and continuing until age 75. Your health care provider may recommend screening at age 45. You will have tests every  1-10 years, depending on your results and the type of screening test. People at increased risk should start screening at an earlier age. Screening tests may include: ? Guaiac-based fecal occult blood testing. ? Fecal immunochemical test (FIT). ? Stool DNA test. ? Virtual colonoscopy. ? Sigmoidoscopy. During this test, a flexible tube with a tiny camera (sigmoidoscope) is used to examine your rectum and lower colon. The sigmoidoscope is inserted through your anus into your rectum and lower colon. ? Colonoscopy. During this test, a long, thin, flexible tube with a tiny camera (colonoscope) is used to examine your entire colon and rectum.  Hepatitis C blood test.  Hepatitis B blood test.  Sexually transmitted disease (STD) testing.  Diabetes screening. This is done by checking your blood sugar (glucose) after you have not eaten for a while (fasting). You may have this done every 1-3 years.  Mammogram. This may be done every 1-2 years. Talk to your health care provider about when you should start having regular mammograms. This may depend on whether you have a family history of breast cancer.  BRCA-related cancer screening. This may be done if you have a family history of breast, ovarian, tubal, or peritoneal cancers.  Pelvic exam and Pap test. This may be done every 3 years starting at age 21. Starting at age 30, this may be done every 5 years if you have a Pap test in combination with an HPV test.  Bone density scan. This is done to screen for osteoporosis. You may have this scan if you are at high risk for osteoporosis. Discuss your test results, treatment options,   and if necessary, the need for more tests with your health care provider. Vaccines Your health care provider may recommend certain vaccines, such as:  Influenza vaccine. This is recommended every year.  Tetanus, diphtheria, and acellular pertussis (Tdap, Td) vaccine. You may need a Td booster every 10 years.  Varicella  vaccine. You may need this if you have not been vaccinated.  Zoster vaccine. You may need this after age 88.  Measles, mumps, and rubella (MMR) vaccine. You may need at least one dose of MMR if you were born in 1957 or later. You may also need a second dose.  Pneumococcal 13-valent conjugate (PCV13) vaccine. You may need this if you have certain conditions and were not previously vaccinated.  Pneumococcal polysaccharide (PPSV23) vaccine. You may need one or two doses if you smoke cigarettes or if you have certain conditions.  Meningococcal vaccine. You may need this if you have certain conditions.  Hepatitis A vaccine. You may need this if you have certain conditions or if you travel or work in places where you may be exposed to hepatitis A.  Hepatitis B vaccine. You may need this if you have certain conditions or if you travel or work in places where you may be exposed to hepatitis B.  Haemophilus influenzae type b (Hib) vaccine. You may need this if you have certain conditions. Talk to your health care provider about which screenings and vaccines you need and how often you need them. This information is not intended to replace advice given to you by your health care provider. Make sure you discuss any questions you have with your health care provider. Document Released: 04/17/2015 Document Revised: 05/11/2017 Document Reviewed: 01/20/2015 Elsevier Interactive Patient Education  2019 [employer]     Postmenopausal Bleeding  Postmenopausal bleeding is any bleeding that a woman has after she has entered into menopause. Menopause is the end of a woman's fertile years. After menopause, a woman no longer ovulates and does not have menstrual periods. Postmenopausal bleeding may have various causes, including:  Menopausal hormone therapy (MHT).  Endometrial atrophy. After menopause, low estrogen hormone levels cause the membrane that lines the uterus (endometrium) to become thinner. You  may have bleeding as the endometrium thins.  Endometrial hyperplasia. This condition is caused by excess estrogen hormones and low levels of progesterone hormones. The excess estrogen causes the endometrium to thicken, which can lead to bleeding. In some cases, this can lead to cancer of the uterus.  Endometrial cancer.  Non-cancerous growths (polyps) on the endometrium, the lining of the uterus, or the cervix.  Uterine fibroids. These are non-cancerous growths in or around the uterus muscle tissue that can cause heavy bleeding. Any type of postmenopausal bleeding, even if it appears to be a typical menstrual period, should be evaluated by your health care provider. Treatment will depend on the cause of the bleeding. Follow these instructions at home:  Pay attention to any changes in your symptoms.  Avoid using tampons and douches as told by your health care provider.  Change your pads regularly.  Get regular pelvic exams and Pap tests.  Take iron supplements as told by your health care provider.  Take over-the-counter and prescription medicines only as told by your health care provider.  Keep all follow-up visits as told by your health care provider. This is important. Contact a health care provider if:  Your bleeding lasts more than 1 week.  You have abdominal pain.  You have bleeding with or after sexual  intercourse.  You have bleeding that happens more often than every 3 weeks. Get help right away if:  You have a fever, chills, headache, dizziness, muscle aches, and bleeding.  You have severe pain with bleeding.  You are passing blood clots.  You have heavy bleeding, need more than 1 pad an hour, and have never experienced this before.  You feel faint. Summary  Postmenopausal bleeding is any bleeding that a woman has after she has entered into menopause.  Postmenopausal bleeding may have various causes. Treatment will depend on the cause of the bleeding.  Any  type of postmenopausal bleeding, even if it appears to be a typical menstrual period, should be evaluated by your health care provider.  Be sure to pay attention to any changes in your symptoms and keep all follow-up visits as told by your health care provider. This information is not intended to replace advice given to you by your health care provider. Make sure you discuss any questions you have with your health care provider. Document Released: 06/29/2005 Document Revised: 06/14/2016 Document Reviewed: 06/14/2016 Elsevier Interactive Patient Education  2019 [employer].

## 2018-06-20 ENCOUNTER — Other Ambulatory Visit (INDEPENDENT_AMBULATORY_CARE_PROVIDER_SITE_OTHER): Payer: Self-pay | Admitting: Vascular Surgery

## 2018-06-20 ENCOUNTER — Other Ambulatory Visit (INDEPENDENT_AMBULATORY_CARE_PROVIDER_SITE_OTHER): Payer: Managed Care, Other (non HMO)

## 2018-06-20 ENCOUNTER — Encounter: Payer: Self-pay | Admitting: Obstetrics and Gynecology

## 2018-06-20 ENCOUNTER — Encounter (INDEPENDENT_AMBULATORY_CARE_PROVIDER_SITE_OTHER): Payer: Self-pay | Admitting: Vascular Surgery

## 2018-06-20 ENCOUNTER — Other Ambulatory Visit: Payer: Self-pay

## 2018-06-20 ENCOUNTER — Ambulatory Visit (INDEPENDENT_AMBULATORY_CARE_PROVIDER_SITE_OTHER): Payer: Managed Care, Other (non HMO) | Admitting: Vascular Surgery

## 2018-06-20 VITALS — BP 144/79 | HR 65 | Resp 10 | Ht 63.0 in | Wt 251.0 lb

## 2018-06-20 DIAGNOSIS — I890 Lymphedema, not elsewhere classified: Secondary | ICD-10-CM | POA: Diagnosis not present

## 2018-06-20 DIAGNOSIS — I872 Venous insufficiency (chronic) (peripheral): Secondary | ICD-10-CM

## 2018-06-20 DIAGNOSIS — I739 Peripheral vascular disease, unspecified: Secondary | ICD-10-CM | POA: Diagnosis not present

## 2018-06-20 LAB — PROGESTERONE: Progesterone: 0.3 ng/mL

## 2018-06-20 LAB — ESTRADIOL: Estradiol: 31.9 pg/mL

## 2018-06-20 LAB — FSH/LH
FSH: 37.1 m[IU]/mL
LH: 22.1 m[IU]/mL

## 2018-06-20 NOTE — Progress Notes (Signed)
Subjective:    Patient ID: Tammy Webb, female    DOB: [birth year], 63 y.o.   MRN: [id number] Chief Complaint  Patient presents with  . Follow-up   Patient presents for 79-month lymphedema follow-up.  Since our last visit, the patient has been gauging conservative therapy including wearing medical grade 1 compression socks, elevating her legs and remaining active.  Patient has been using her lymphedema pump 1-2 times a day.  Patient notes a market improvement in her bilateral lower extremity edema and discomfort.  The patient is very pleased with her improvement.  Patient notes intermittent cramping to the bilateral calves with activity.  Denies any rest pain or ulcer formation to the bilateral legs.  Patient does have multiple risk factors for peripheral artery disease and a stat ABI was performed.  Bilateral ABI was notable for: Audibly triphasic anterior and posterior tibial arteries bilaterally.  Right ABI 1.19 and left ABI 1.21.  Patient denies any fever, nausea vomiting.  Review of Systems  Constitutional: Negative.   HENT: Negative.   Eyes: Negative.   Respiratory: Negative.   Cardiovascular:       Intermittent calf cramping  Gastrointestinal: Negative.   Endocrine: Negative.   Genitourinary: Negative.   Musculoskeletal: Negative.   Skin: Negative.   Allergic/Immunologic: Negative.   Neurological: Negative.   Hematological: Negative.   Psychiatric/Behavioral: Negative.       Objective:   Physical Exam Vitals signs reviewed.  Constitutional:      Appearance: Normal appearance. She is obese.  HENT:     Head: Normocephalic and atraumatic.     Right Ear: External ear normal.     Left Ear: External ear normal.     Mouth/Throat:     Mouth: Mucous membranes are moist.     Pharynx: Oropharynx is clear.  Eyes:     Extraocular Movements: Extraocular movements intact.     Conjunctiva/sclera: Conjunctivae normal.     Pupils: Pupils are equal, round, and reactive to light.   Neck:     Musculoskeletal: Normal range of motion.  Cardiovascular:     Rate and Rhythm: Normal rate and regular rhythm.     Comments: Hard to palpate pedal pulses however the bilateral feet are warm Pulmonary:     Effort: Pulmonary effort is normal.     Breath sounds: Normal breath sounds.  Musculoskeletal: Normal range of motion.        General: Swelling (Mild bilateral lower extremity nonpitting edema) present.  Skin:    General: Skin is warm and dry.  Neurological:     General: No focal deficit present.     Mental Status: She is alert and oriented to person, place, and time. Mental status is at baseline.  Psychiatric:        Mood and Affect: Mood normal.        Behavior: Behavior normal.        Thought Content: Thought content normal.        Judgment: Judgment normal.    BP (!) 144/79 (BP Location: Left Arm, Patient Position: Sitting, Cuff Size: Large)   Pulse 65   Resp 10   Ht 5\' 3"  (1.6 m)   Wt 251 lb (113.9 kg)   BMI 44.46 kg/m   Past Medical History:  Diagnosis Date  . Arthritis    knees  . Cancer (HCC) 07/2005   Left Breast   . GERD (gastroesophageal reflux disease)   . Hypertension   . Sleep apnea  cpap   Social History   Socioeconomic History  . Marital status: Divorced    Spouse name: Not on file  . Number of children: Not on file  . Years of education: Not on file  . Highest education level: Not on file  Occupational History  . Not on file  Social Needs  . Financial resource strain: Not on file  . Food insecurity:    Worry: Not on file    Inability: Not on file  . Transportation needs:    Medical: Not on file    Non-medical: Not on file  Tobacco Use  . Smoking status: Never Smoker  . Smokeless tobacco: Never Used  Substance and Sexual Activity  . Alcohol use: No    Alcohol/week: 0.0 standard drinks    Comment: occasionally  . Drug use: No  . Sexual activity: Not Currently    Birth control/protection: Post-menopausal  Lifestyle  .  Physical activity:    Days per week: Not on file    Minutes per session: Not on file  . Stress: Not on file  Relationships  . Social connections:    Talks on phone: Not on file    Gets together: Not on file    Attends religious service: Not on file    Active member of club or organization: Not on file    Attends meetings of clubs or organizations: Not on file    Relationship status: Not on file  . Intimate partner violence:    Fear of current or ex partner: Not on file    Emotionally abused: Not on file    Physically abused: Not on file    Forced sexual activity: Not on file  Other Topics Concern  . Not on file  Social History Narrative  . Not on file   Past Surgical History:  Procedure Laterality Date  . BREAST SURGERY Bilateral 12/2005   Mastectomies with reconstruction  . CESAREAN SECTION  1993  . COLONOSCOPY  2015  . COLONOSCOPY WITH PROPOFOL N/A 05/02/2017   Procedure: COLONOSCOPY WITH PROPOFOL;  Surgeon: Midge Minium, MD;  Location: [facility] ENDOSCOPY;  Service: Endoscopy;  Laterality: N/A;  . LAPAROSCOPIC APPENDECTOMY N/A 05/29/2017   Procedure: APPENDECTOMY LAPAROSCOPIC;  Surgeon: Earline Mayotte, MD;  Location: [facility] ORS;  Service: General;  Laterality: N/A;  . TONSILLECTOMY  1980   Family History  Problem Relation Age of Onset  . Breast cancer Maternal Aunt   . Ovarian cancer Paternal Aunt    Allergies  Allergen Reactions  . Morphine And Related Rash    Patient not sure of where reaction came from  . Seasonal Ic [Cholestatin] Other (See Comments)    Runny nose, etc  . Triamcinolone Acetonide Hives    Patient unaware of this reaction      Assessment & Plan:  Patient presents for 10-month lymphedema follow-up.  Since our last visit, the patient has been gauging conservative therapy including wearing medical grade 1 compression socks, elevating her legs and remaining active.  Patient has been using her lymphedema pump 1-2 times a day.  Patient notes a market  improvement in her bilateral lower extremity edema and discomfort.  The patient is very pleased with her improvement.  Patient notes intermittent cramping to the bilateral calves with activity.  Denies any rest pain or ulcer formation to the bilateral legs.  Patient does have multiple risk factors for peripheral artery disease and a stat ABI was performed.  Bilateral ABI was notable for: Audibly triphasic anterior  and posterior tibial arteries bilaterally.  Right ABI 1.19 and left ABI 1.21.  Patient denies any fever, nausea vomiting.  1. Chronic venous insufficiency - Stable The patient has been engaging in conservative therapy including wearing medical grade 1 compression socks, elevating her legs and remaining active Patient also uses her lymphedema pump 1-2 times a day in an elevated position The patient has noted a market improvement in her bilateral lower extremity edema and discomfort The patient should continue to engage in conservative therapy with the added addition of lymphedema pump 1-2 times a day The patient should follow-up PRN  2. Lymphedema - Stable As Above  3. Claudication [facility]) - New Patient was experiencing intermittent calf cramping to the bilateral legs with activity Bilateral ABI was audibly triphasic tibials and normal ABIs Do not feel that the patient's intermittent bilateral calf cramping is from any significant peripheral artery disease Patient to follow-up with the primary care physician for further work-up  - VAS [place] ABI WITH/WO TBI; Future  Current Outpatient Medications on File Prior to Visit  Medication Sig Dispense Refill  . bacitracin 500 UNIT/GM ointment Apply 1 application topically daily as needed for wound care.    . Calcium Carbonate-Vit D-Min (CALTRATE 600+D PLUS PO) Take 1 tablet by mouth once a week. IN THE EVENING    . Coenzyme Q10 50 MG CAPS Take 50 mg by mouth every evening.     . Dermatological Products, Misc. (HPR PLUS) CREA Apply 1 Dose  topically as needed. For skin irritation    . desonide (VERDESO) 0.05 % foam Apply 1 application topically daily.     Marland Kitchen EPIPEN 2-PAK 0.3 MG/0.3ML SOAJ injection Inject 0.3 mg into the muscle daily as needed (FOR ALLERGIC REACTIONS.).     Marland Kitchen Fluticasone Propionate 93 MCG/ACT EXHU Place 1 Dose into the nose as needed.    . Fluticasone-Salmeterol (ADVAIR) 250-50 MCG/DOSE AEPB     . Hyprom-Naphaz-Polysorb-Zn Sulf (CLEAR EYES COMPLETE OP) Apply 1 drop to eye. As needed for eyes    . ibuprofen (ADVIL,MOTRIN) 200 MG tablet Take 400 mg by mouth every 8 (eight) hours as needed (for pain.).     Valerie Salts Polysacch (ALCORTIN A) 1-2-1 % GEL Apply 1 Dose topically as needed (for irritation).    Marland Kitchen loratadine (CLARITIN) 10 MG tablet Take 10 mg by mouth every Saturday. In the morning.    Marland Kitchen losartan (COZAAR) 100 MG tablet Take 100 mg by mouth every evening.     . Misc Natural Products (OSTEO BI-FLEX ADV TRIPLE ST PO) Take 1 tablet by mouth daily.     . mometasone (ELOCON) 0.1 % lotion Apply 1 Dose topically daily as needed. For ears    . Multiple Vitamins-Minerals (MULTIVITAMIN WITH MINERALS) tablet Take 1 tablet by mouth daily. Centrum Silver    . nystatin cream (MYCOSTATIN) Apply 1 application topically 2 (two) times daily as needed (for dry skin/skin fungus.).     Marland Kitchen Polyethyl Glycol-Propyl Glycol (SYSTANE ULTRA OP) Apply 1 drop to eye daily as needed. For eyes    . diphenhydrAMINE (BENADRYL) 25 MG tablet Take 25 mg by mouth every 8 (eight) hours as needed (FOR ALLERGIES.).     Marland Kitchen Elastic Bandages & Supports (ABDOMINAL BINDER/ELASTIC LARGE) MISC 1 device to use daily 1 each 0  . Omega-3 Fatty Acids (OMEGA 3 PO) Take 1 capsule by mouth once a week. Complete Omega    . phentermine (ADIPEX-P) 37.5 MG tablet Take 18.75 mg by mouth daily before breakfast.  1  . triamcinolone cream (KENALOG) 0.1 % Apply 1 application topically 2 (two) times daily as needed.     No current facility-administered medications  on file prior to visit.    There are no Patient Instructions on file for this visit. No follow-ups on file.  Zayvier Caravello A Joelyn Lover, PA-C

## 2018-06-26 NOTE — Telephone Encounter (Signed)
05/01/18 PCP labs- CMP, Lipids scanned in media 05/28/18.

## 2018-06-27 ENCOUNTER — Telehealth: Payer: Self-pay

## 2018-06-27 ENCOUNTER — Telehealth (INDEPENDENT_AMBULATORY_CARE_PROVIDER_SITE_OTHER): Payer: Self-pay | Admitting: Vascular Surgery

## 2018-06-27 NOTE — Telephone Encounter (Signed)
Patient was seen in our office on 3/18 by Maudie Mercury. Patient received results from ultrasound that day. Patient has no upcoming apts with our clinic. I called the patient and left her a message stating I was unsure of what she was requesting and that she had just been seen recently and had no upcoming apt but that she could call us back if needed. AS, CMA

## 2018-06-27 NOTE — Telephone Encounter (Signed)
lmtrc

## 2018-06-28 ENCOUNTER — Encounter: Payer: Managed Care, Other (non HMO) | Admitting: Obstetrics and Gynecology

## 2018-06-28 ENCOUNTER — Ambulatory Visit (INDEPENDENT_AMBULATORY_CARE_PROVIDER_SITE_OTHER): Payer: Managed Care, Other (non HMO)

## 2018-06-28 ENCOUNTER — Other Ambulatory Visit: Payer: Managed Care, Other (non HMO)

## 2018-06-28 ENCOUNTER — Other Ambulatory Visit: Payer: Self-pay

## 2018-06-28 ENCOUNTER — Ambulatory Visit: Payer: Managed Care, Other (non HMO) | Admitting: Obstetrics and Gynecology

## 2018-06-28 ENCOUNTER — Encounter: Payer: Self-pay | Admitting: Obstetrics and Gynecology

## 2018-06-28 VITALS — BP 139/85 | HR 67 | Ht 63.0 in | Wt 258.1 lb

## 2018-06-28 DIAGNOSIS — N950 Postmenopausal bleeding: Secondary | ICD-10-CM

## 2018-06-28 DIAGNOSIS — L309 Dermatitis, unspecified: Secondary | ICD-10-CM | POA: Diagnosis not present

## 2018-06-28 DIAGNOSIS — D251 Intramural leiomyoma of uterus: Secondary | ICD-10-CM | POA: Diagnosis not present

## 2018-06-28 DIAGNOSIS — R9389 Abnormal findings on diagnostic imaging of other specified body structures: Secondary | ICD-10-CM

## 2018-06-28 NOTE — Progress Notes (Signed)
Pt present for test results after getting an ultrasound due to postmenopause bleeding.

## 2018-06-29 ENCOUNTER — Encounter: Payer: Self-pay | Admitting: Obstetrics and Gynecology

## 2018-06-29 ENCOUNTER — Other Ambulatory Visit: Payer: Self-pay | Admitting: Obstetrics and Gynecology

## 2018-06-29 NOTE — Progress Notes (Signed)
    GYNECOLOGY PROGRESS NOTE  Subjective:    Patient ID: Tammy Webb, female    DOB: 02-Aug-1955, 63 y.o.   MRN: [id number]  HPI  Patient is a 63 y.o. G53P1020 female who presents for f/u after ultrasound for evaluation for PMB.  She notes that she has not had any further episodes of bleeding.    She also continues to complain of her rash on her abdomen. Notes she has been treating it over the years off and on with a multitude of creams and ointments (prescription and OTC) but nothing seems to help.   The following portions of the patient's history were reviewed and updated as appropriate: allergies, current medications, past family history, past medical history, past social history, past surgical history and problem list.  Review of Systems Pertinent items noted in HPI and remainder of comprehensive ROS otherwise negative.   Objective:   Blood pressure 139/85, pulse 67, height 5\' 3"  (1.6 m), weight 258 lb 1.6 oz (117.1 kg). General appearance: alert and no distress Abdomen: soft, non-tender; bowel sounds normal; no masses,  no organomegaly Pelvic: external genitalia normal, rectovaginal septum normal.  Vagina without discharge.  Cervix normal appearing, no lesions and no motion tenderness.  Uterus mobile, nontender, normal shape and size.  Adnexae non-palpable, nontender bilaterally.  Skin: erythematous rash noted under pannus and in groin regions. Non-tender.  Neurologic: Grossly intact  Assessment:   Postmenopausal bleeding Chronic skin dermatitis  Plan:   1. PMB - ultrasound reviewed. Patient with thickened endometrium noted.  Discussed need for endometrial biopsy.  Patient ok to have procedure performed today (see procedure note below). Will notify patient of results and discuss further management.  2. Chronic skin dermatitis - patient has been treated with Nystatin creams and powders, as well as a few ointments (notes ointments cause a different type of rash on her skin).  Discussed combining Nystatin cream or Dante's butt paste (OTC) with Trimacinolone cream (patient already has this at home), and mix with OTC Zinc oxide cream.  Once rash is gone, advised on use of cornstarch (as she notes that sometimes the nystatin powder or Gold bond causes worsening of the rash and itching).  Patient may benefit from referral to Dermatology. She notes that she does not have a h/o diabetes.     Endometrial Biopsy Procedure Note  The patient is positioned on the exam table in the dorsal lithotomy position. Bimanual exam confirms uterine position and size. A Graves speculum is placed into the vagina. A single toothed tenaculum is placed onto the anterior lip of the cervix. The pipette is placed into the endocervical canal and is advanced to the uterine fundus. Using a piston like technique, with vacuum created by withdrawing the stylus, the endometrial specimen is obtained and transferred to the biopsy container. Minimal bleeding is encountered. The procedure is well tolerated.   Uterine Position:mid    Uterine Length: 8  cm   Uterine Specimen: Scant   Post procedure instructions are given. The patient is scheduled for follow up appointment.   Rubie Maid, MD [facility] 06/29/2018 7:43 PM

## 2018-07-02 LAB — PATHOLOGY

## 2018-07-18 ENCOUNTER — Other Ambulatory Visit: Payer: Self-pay

## 2018-07-18 ENCOUNTER — Encounter: Payer: Self-pay | Admitting: Psychiatry

## 2018-07-18 ENCOUNTER — Ambulatory Visit (INDEPENDENT_AMBULATORY_CARE_PROVIDER_SITE_OTHER): Payer: 59 | Admitting: Psychiatry

## 2018-07-18 DIAGNOSIS — R413 Other amnesia: Secondary | ICD-10-CM

## 2018-07-18 DIAGNOSIS — F320 Major depressive disorder, single episode, mild: Secondary | ICD-10-CM
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
## 2018-07-18 NOTE — Progress Notes (Signed)
TC  On 07-18-18 @ 8:28 pt medical and surgical hx section was completed/ updated. Pt allergies were reviewed with no changes. Pt medications and pharmacy were reviewed and updated. No vitals taken due to this is a phone visit.

## 2018-07-19 ENCOUNTER — Ambulatory Visit (INDEPENDENT_AMBULATORY_CARE_PROVIDER_SITE_OTHER): Payer: 59 | Admitting: Licensed Clinical Social Worker

## 2018-07-19 DIAGNOSIS — R413 Other amnesia: Secondary | ICD-10-CM | POA: Diagnosis not present

## 2018-07-19 DIAGNOSIS — F320 Major depressive disorder, single episode, mild: Secondary | ICD-10-CM | POA: Diagnosis not present

## 2018-07-19 NOTE — Progress Notes (Signed)
Comprehensive Clinical Assessment (CCA) Note  07/19/2018 Tammy Webb [id number]  Visit Diagnosis:      ICD-10-CM   1. MDD (major depressive disorder), single episode, mild ([place]) F32.0   2. Memory disturbance R41.3       CCA Part One  Part One has been completed on paper by the patient.  (See scanned document in Chart Review)  CCA Part Two A  Intake/Chief Complaint:  CCA Intake With Chief Complaint CCA Part Two Date: 07/19/18 CCA Part Two Time: 0802 Individual's Strengths: listener, being able to find something to do, enjoying nature Individual's Preferences: lose weight and stay thin Type of Services Patient Feels Are Needed: therapy  Mental Health Symptoms Depression:  Depression: Increase/decrease in appetite, Difficulty Concentrating, Change in energy/activity, Fatigue, Worthlessness, Irritability, Sleep (too much or little), Weight gain/loss, Tearfulness  Mania:  Mania: N/A  Anxiety:   Anxiety: Worrying  Psychosis:  Psychosis: N/A  Trauma:  Trauma: Avoids reminders of event  Obsessions:  Obsessions: N/A  Compulsions:  Compulsions: N/A  Inattention:  Inattention: N/A  Hyperactivity/Impulsivity:  Hyperactivity/Impulsivity: N/A  Oppositional/Defiant Behaviors:  Oppositional/Defiant Behaviors: N/A  Borderline Personality:  Emotional Irregularity: N/A  Other Mood/Personality Symptoms:      Mental Status Exam Appearance and self-care  Stature:     Weight:     Clothing:     Grooming:     Cosmetic use:     Posture/gait:     Motor activity:     Sensorium  Attention:  Attention: Distractible  Concentration:  Concentration: Anxiety interferes  Orientation:  Orientation: X5  Recall/memory:  Recall/Memory: Normal  Affect and Mood  Affect:  Affect: Appropriate  Mood:  Mood: Anxious  Relating  Eye contact:     Facial expression:     Attitude toward examiner:     Thought and Language  Speech flow:    Thought content:     Preoccupation:     Hallucinations:      Organization:     Transport planner of Knowledge:     Intelligence:     Abstraction:     Judgement:     Reality Testing:     Insight:     Decision Making:     Social Functioning  Social Maturity:     Social Judgement:     Stress  Stressors:  Stressors: Illness  Coping Ability:  Coping Ability: English as a second language teacher Deficits:     Supports:      Family and Psychosocial History: Family history Marital status: Divorced Divorced, when?: 1973 & 2001 Does patient have children?: Yes How many children?: 1(Jane 27) How is patient's relationship with their children?: we do not talk all of the time.  I wish she would call more often.    Childhood History:  Childhood History By whom was/is the patient raised?: Both parents Does patient have siblings?: Yes Number of Siblings: 5 Description of patient's current relationship with siblings: decent Did patient suffer any verbal/emotional/physical/sexual abuse as a child?: No Did patient suffer from severe childhood neglect?: No Has patient ever been sexually abused/assaulted/raped as an adolescent or adult?: No Was the patient ever a victim of a crime or a disaster?: No Witnessed domestic violence?: No Has patient been effected by domestic violence as an adult?: No  CCA Part Two B  Employment/Work Situation: Employment / Work Situation Employment situation: Employed Where is patient currently employed?: [employer] How long has patient been employed?: 49yrs What is the longest time patient has a held  a job?: 22 yrs Where was the patient employed at that time?: [employer] Did You Receive Any Psychiatric Treatment/Services While in the [employer]?: No Are There Guns or Other Weapons in [place]?: No Are These Psychologist, educational?: No  Education: Education Name of [place]: [place] Did Teacher, adult education From [employer]?: Yes Did Physicist, medical?: Yes What Type of College Degree Do you Have?: Associates Did You  Attend Graduate School?: No What Was Your Major?: Cytology Did You Have An Individualized Education Program (IIEP): No Did You Have Any Difficulty At School?: No  Religion: Religion/Spirituality Are You A Religious Person?: No  Leisure/Recreation: Leisure / Recreation Leisure and Hobbies: hanging out with friends, resting, sew, needlepoint, kayak  Exercise/Diet: Exercise/Diet Do You Exercise?: No Have You Gained or Lost A Significant Amount of Weight in the Past Six Months?: No Do You Follow a Special Diet?: No Do You Have Any Trouble Sleeping?: Yes Explanation of Sleeping Difficulties: difficulty falling asleep  CCA Part Two C  Alcohol/Drug Use: Alcohol / Drug Use Pain Medications: denies Prescriptions: see record Over the Counter: aspirin History of alcohol / drug use?: No history of alcohol / drug abuse                      CCA Part Three  ASAM's:  Six Dimensions of Multidimensional Assessment  Dimension 1:  Acute Intoxication and/or Withdrawal Potential:     Dimension 2:  Biomedical Conditions and Complications:     Dimension 3:  Emotional, Behavioral, or Cognitive Conditions and Complications:     Dimension 4:  Readiness to Change:     Dimension 5:  Relapse, Continued use, or Continued Problem Potential:     Dimension 6:  Recovery/Living Environment:      Substance use Disorder (SUD)    Social Function:     Stress:  Stress Stressors: Illness Coping Ability: Overwhelmed Patient Takes Medications The Way The Doctor Instructed?: Yes Priority Risk: Low Acuity  Risk Assessment- Self-Harm Potential: Risk Assessment For Self-Harm Potential Thoughts of Self-Harm: No current thoughts Method: No plan Availability of Means: No access/NA  Risk Assessment -Dangerous to Others Potential: Risk Assessment For Dangerous to Others Potential Method: No Plan Availability of Means: No access or NA Intent: Vague intent or NA Notification Required: No need or  identified person  DSM5 Diagnoses: Patient Active Problem List   Diagnosis Date Noted  . Claudication ([place]) 06/20/2018  . Lymphedema 12/20/2017  . Essential hypertension 08/08/2017  . Chronic venous insufficiency 08/08/2017  . Personal history of colonic polyps   . Adenoma of appendix     Patient Centered Plan: Patient is on the following Treatment Plan(s):  Depression  Recommendations for Services/Supports/Treatments: Recommendations for Services/Supports/Treatments Recommendations For Services/Supports/Treatments: Individual Therapy, Medication Management  Treatment Plan Summary:    Referrals to Alternative Service(s): Referred to Alternative Service(s):   Place:   Date:   Time:    Referred to Alternative Service(s):   Place:   Date:   Time:    Referred to Alternative Service(s):   Place:   Date:   Time:    Referred to Alternative Service(s):   Place:   Date:   Time:     [place]

## 2018-07-23 ENCOUNTER — Telehealth: Payer: Self-pay

## 2018-07-23 NOTE — Telephone Encounter (Signed)
pt called left message that she was wondering if she was suppose to get a test done if so what

## 2018-07-23 NOTE — Telephone Encounter (Signed)
Yes TSH,vitamin b12.  Please let her know if she did not receive the lab report in mail to come and pick up from front desk.

## 2018-08-08 ENCOUNTER — Other Ambulatory Visit: Payer: Self-pay | Admitting: Psychiatry

## 2018-08-09 ENCOUNTER — Telehealth: Payer: Self-pay | Admitting: Psychiatry

## 2018-08-09 LAB — VITAMIN B12: Vitamin B-12: 973 pg/mL (ref 232–1245)

## 2018-08-09 LAB — TSH: TSH: 4.58 u[IU]/mL — ABNORMAL HIGH (ref 0.450–4.500)

## 2018-08-09 NOTE — Telephone Encounter (Signed)
labwork was faxed and confirmed to dr. Lavera Guise office for review.

## 2018-08-09 NOTE — Telephone Encounter (Signed)
Pt had labwork done yesterday.

## 2018-08-09 NOTE — Telephone Encounter (Signed)
Pt was called and given the results and also told that Dr. Lavera Guise office was faxed the labwork result and to make sure she call their office to set up appt to go over labwork result.

## 2018-08-09 NOTE — Telephone Encounter (Signed)
Called patient, discussed TSH elevated . She will contact PMD. I have sent a message to PMD and will ask Jess to fax the lab results as well.

## 2018-08-16 ENCOUNTER — Ambulatory Visit (INDEPENDENT_AMBULATORY_CARE_PROVIDER_SITE_OTHER): Payer: 59 | Admitting: Licensed Clinical Social Worker

## 2018-08-16 ENCOUNTER — Other Ambulatory Visit: Payer: Self-pay

## 2018-08-16 DIAGNOSIS — F320 Major depressive disorder, single episode, mild: Secondary | ICD-10-CM

## 2018-08-21 ENCOUNTER — Other Ambulatory Visit: Payer: Self-pay

## 2018-08-21 ENCOUNTER — Ambulatory Visit (INDEPENDENT_AMBULATORY_CARE_PROVIDER_SITE_OTHER): Payer: 59 | Admitting: Psychiatry

## 2018-08-21 DIAGNOSIS — Z5329 Procedure and treatment not carried out because of patient's decision for other reasons: Secondary | ICD-10-CM

## 2018-08-21 NOTE — Telephone Encounter (Signed)
Patient called requesting refill of Lipitor 40 mg qd, 90 day supply to [facility] in [place].

## 2018-08-21 NOTE — Progress Notes (Deleted)
BH MD/PA/NP OP Progress Note  08/21/2018 9:13 AM Tammy Webb  MRN:  [id number]  Chief Complaint:  HPI: *** Visit Diagnosis: No diagnosis found.  Past Psychiatric History: Reviewed past psychiatric history from my progress note on 07/18/2018.  Past trials of Celexa.  Past Medical History:  Past Medical History:  Diagnosis Date  . Arthritis    knees  . Cancer ([place]) 07/2005   Left Breast   . Depression   . GERD (gastroesophageal reflux disease)   . Hypertension   . Sleep apnea    cpap    Past Surgical History:  Procedure Laterality Date  . BREAST SURGERY Bilateral 12/2005   Mastectomies with reconstruction  . CESAREAN SECTION  1993  . COLONOSCOPY  2015  . COLONOSCOPY WITH PROPOFOL N/A 05/02/2017   Procedure: COLONOSCOPY WITH PROPOFOL;  Surgeon: Lucilla Lame, MD;  Location: [facility] ENDOSCOPY;  Service: Endoscopy;  Laterality: N/A;  . LAPAROSCOPIC APPENDECTOMY N/A 05/29/2017   Procedure: APPENDECTOMY LAPAROSCOPIC;  Surgeon: Robert Bellow, MD;  Location: [facility] ORS;  Service: General;  Laterality: N/A;  . TONSILLECTOMY  1980    Family Psychiatric History: I have reviewed family psychiatric history from my progress note on 07/18/2018.  Family History:  Family History  Problem Relation Age of Onset  . Breast cancer Maternal Aunt   . Ovarian cancer Paternal Aunt     Social History: Reviewed social history from my progress note on 07/18/2018 Social History   Socioeconomic History  . Marital status: Divorced    Spouse name: Not on file  . Number of children: 1  . Years of education: Not on file  . Highest education level: Some college, no degree  Occupational History  . Not on file  Social Needs  . Financial resource strain: Not hard at all  . Food insecurity:    Worry: Never true    Inability: Never true  . Transportation needs:    Medical: No    Non-medical: No  Tobacco Use  . Smoking status: Never Smoker  . Smokeless tobacco: Never Used  Substance and Sexual  Activity  . Alcohol use: No    Alcohol/week: 0.0 standard drinks    Comment: occasionally  . Drug use: No  . Sexual activity: Not Currently    Birth control/protection: Post-menopausal  Lifestyle  . Physical activity:    Days per week: 0 days    Minutes per session: 0 min  . Stress: Not on file  Relationships  . Social connections:    Talks on phone: Not on file    Gets together: Not on file    Attends religious service: Never    Active member of club or organization: Yes    Attends meetings of clubs or organizations: 1 to 4 times per year    Relationship status: Divorced  Other Topics Concern  . Not on file  Social History Narrative   Bulling at work    Allergies:  Allergies  Allergen Reactions  . Morphine And Related Rash    Patient not sure of where reaction came from  . Seasonal Ic [Cholestatin] Other (See Comments)    Runny nose, etc  . Triamcinolone Acetonide Hives    Patient unaware of this reaction    Metabolic Disorder Labs: No results found for: HGBA1C, MPG No results found for: PROLACTIN No results found for: CHOL, TRIG, HDL, CHOLHDL, VLDL, LDLCALC Lab Results  Component Value Date   TSH 4.580 (H) 08/08/2018    Therapeutic Level Labs: No  results found for: LITHIUM No results found for: VALPROATE No components found for:  CBMZ  Current Medications: Current Outpatient Medications  Medication Sig Dispense Refill  . bacitracin 500 UNIT/GM ointment Apply 1 application topically daily as needed for wound care.    Marland Kitchen buPROPion (WELLBUTRIN) 75 MG tablet Take 75 mg by mouth daily.    . Calcium Carbonate-Vit D-Min (CALTRATE 600+D PLUS PO) Take 1 tablet by mouth once a week. IN THE EVENING    . Coenzyme Q10 50 MG CAPS Take 50 mg by mouth every evening.     . Dermatological Products, Misc. (HPR PLUS) CREA Apply 1 Dose topically as needed. For skin irritation    . desonide (VERDESO) 0.05 % foam Apply 1 application topically daily.     . diphenhydrAMINE  (BENADRYL) 25 MG tablet Take 25 mg by mouth every 8 (eight) hours as needed (FOR ALLERGIES.).     Marland Kitchen Elastic Bandages & Supports (ABDOMINAL BINDER/ELASTIC LARGE) MISC 1 device to use daily 1 each 0  . EPIPEN 2-PAK 0.3 MG/0.3ML SOAJ injection Inject 0.3 mg into the muscle daily as needed (FOR ALLERGIC REACTIONS.).     Marland Kitchen EUCRISA 2 % OINT     . Fluticasone Propionate 93 MCG/ACT EXHU Place 1 Dose into the nose as needed.    . Fluticasone-Salmeterol (ADVAIR) 250-50 MCG/DOSE AEPB     . Hyprom-Naphaz-Polysorb-Zn Sulf (CLEAR EYES COMPLETE OP) Apply 1 drop to eye. As needed for eyes    . ibuprofen (ADVIL,MOTRIN) 200 MG tablet Take 400 mg by mouth every 8 (eight) hours as needed (for pain.).     Jerrye Beavers Polysacch (ALCORTIN A) 1-2-1 % GEL Apply 1 Dose topically as needed (for irritation).    Marland Kitchen loratadine (CLARITIN) 10 MG tablet Take 10 mg by mouth every Saturday. In the morning.    Marland Kitchen losartan (COZAAR) 100 MG tablet Take 100 mg by mouth every evening.     . metroNIDAZOLE (METROCREAM) 0.75 % cream     . Misc Natural Products (OSTEO BI-FLEX ADV TRIPLE ST PO) Take 1 tablet by mouth daily.     . mometasone (ELOCON) 0.1 % lotion Apply 1 Dose topically daily as needed. For ears    . Multiple Vitamins-Minerals (MULTIVITAMIN WITH MINERALS) tablet Take 1 tablet by mouth daily. Centrum Silver    . nystatin cream (MYCOSTATIN) Apply 1 application topically 2 (two) times daily as needed (for dry skin/skin fungus.).     Marland Kitchen Omega-3 Fatty Acids (OMEGA 3 PO) Take 1 capsule by mouth once a week. Complete Omega    . Polyethyl Glycol-Propyl Glycol (SYSTANE ULTRA OP) Apply 1 drop to eye daily as needed. For eyes    . triamcinolone cream (KENALOG) 0.1 % Apply 1 application topically 2 (two) times daily as needed.     No current facility-administered medications for this visit.      Musculoskeletal: Strength & Muscle Tone: {desc; muscle tone:32375} Gait & Station: {PE GAIT ED BOFB:51025} Patient leans: {Patient  Leans:21022755}  Psychiatric Specialty Exam: ROS  There were no vitals taken for this visit.There is no height or weight on file to calculate BMI.  General Appearance: {Appearance:22683}  Eye Contact:  {BHH EYE CONTACT:22684}  Speech:  {Speech:22685}  Volume:  {Volume (PAA):22686}  Mood:  {BHH MOOD:22306}  Affect:  {Affect (PAA):22687}  Thought Process:  {Thought Process (PAA):22688}  Orientation:  {BHH ORIENTATION (PAA):22689}  Thought Content: {Thought Content:22690}   Suicidal Thoughts:  {ST/HT (PAA):22692}  Homicidal Thoughts:  {ST/HT (PAA):22692}  Memory:  {BHH  YIFOYD:74128}  Judgement:  {Judgement (PAA):22694}  Insight:  {Insight (PAA):22695}  Psychomotor Activity:  {Psychomotor (PAA):22696}  Concentration:  {Concentration:21399}  Recall:  {BHH GOOD/FAIR/POOR:22877}  Fund of Knowledge: {BHH GOOD/FAIR/POOR:22877}  Language: {BHH GOOD/FAIR/POOR:22877}  Akathisia:  {BHH YES OR NO:22294}  Handed:  {Handed:22697}  AIMS (if indicated): {Desc; done/not:10129}  Assets:  {Assets (PAA):22698}  ADL's:  {BHH NOM'V:67209}  Cognition: {chl bhh cognition:304700322}  Sleep:  {BHH GOOD/FAIR/POOR:22877}   Screenings:   Assessment and Plan: ***   Ursula Alert, MD 08/21/2018, 9:13 AM

## 2018-08-22 NOTE — Telephone Encounter (Signed)
Labs done 05/2018

## 2018-08-24 MED ORDER — ATORVASTATIN CALCIUM 40 MG PO TABS
40 MG | ORAL_TABLET | ORAL | 1 refills | Status: DC
Start: 2018-08-24 — End: 2019-08-08

## 2018-08-30 NOTE — Progress Notes (Signed)
Attempted to make contact.

## 2018-09-11 ENCOUNTER — Telehealth: Payer: Self-pay | Admitting: Obstetrics and Gynecology

## 2018-09-11 NOTE — Telephone Encounter (Signed)
Patient called stating she has not heard back about finishing her procedure. She was here in march for an endometrial biopsy and also had an ultrasound. Please advise

## 2018-09-12 NOTE — Telephone Encounter (Signed)
She needs to come in to discuss surgery for removal of the polyp.  It is an outpatient day procedure. We weren't doing surgery from March - May due to the [employer] -19 pandemic but our OR's are opening back up.

## 2018-09-12 NOTE — Telephone Encounter (Signed)
Spoke with pt and informed her of the information given by [facility]. Pt made an appointment to be seen by [facility] to discuss surgery.

## 2018-09-18 ENCOUNTER — Telehealth: Payer: Self-pay

## 2018-09-18 NOTE — Telephone Encounter (Signed)
Pt called no answer LM via voicemail to call the office for prescreening. 

## 2018-09-19 ENCOUNTER — Telehealth: Payer: Self-pay

## 2018-09-19 ENCOUNTER — Ambulatory Visit: Payer: Self-pay | Admitting: Obstetrics and Gynecology

## 2018-09-19 NOTE — Telephone Encounter (Signed)
Pt called no answer LM informing her that [facility] was still in surgery and that she needed to reschedule her appointment for this evening for a later date.

## 2018-09-19 NOTE — Telephone Encounter (Signed)
Called pt to reschedule her appointment with [facility] due to [facility] still in surgery.

## 2018-10-01 ENCOUNTER — Telehealth: Payer: Self-pay

## 2018-10-01 NOTE — Telephone Encounter (Signed)
Pt prescreened no symptoms.   Coronavirus (COVID-19) Are you at risk?  Are you at risk for the Coronavirus (COVID-19)?  To be considered HIGH RISK for Coronavirus (COVID-19), you have to meet the following criteria:  . Traveled to [place], [place], [place], [place] or [place]; or in the [place] to [place], [place], [place], or [place]; and have fever, cough, and shortness of breath within the last 2 weeks of travel OR . Been in close contact with a person diagnosed with COVID-19 within the last 2 weeks and have fever, cough, and shortness of breath . IF YOU DO NOT MEET THESE CRITERIA, YOU ARE CONSIDERED LOW RISK FOR COVID-19.  What to do if you are HIGH RISK for COVID-19?  Marland Kitchen If you are having a medical emergency, call 911. . Seek medical care right away. Before you go to a doctor's office, urgent care or emergency department, call ahead and tell them about your recent travel, contact with someone diagnosed with COVID-19, and your symptoms. You should receive instructions from your physician's office regarding next steps of care.  . When you arrive at healthcare provider, tell the healthcare staff immediately you have returned from visiting [place], [place], [place], [place] or [place]; or traveled in the [place] to [place], [place], [place], or [place]; in the last two weeks or you have been in close contact with a person diagnosed with COVID-19 in the last 2 weeks.   . Tell the health care staff about your symptoms: fever, cough and shortness of breath. . After you have been seen by a medical provider, you will be either: o Tested for (COVID-19) and discharged home on quarantine except to seek medical care if symptoms worsen, and asked to  - Stay home and avoid contact with others until you get your results (4-5 days)  - Avoid travel on public transportation if possible (such as bus, train, or airplane) or o Sent to the Emergency Department by EMS for evaluation,  COVID-19 testing, and possible admission depending on your condition and test results.  What to do if you are LOW RISK for COVID-19?  Reduce your risk of any infection by using the same precautions used for avoiding the common cold or flu:  Marland Kitchen Wash your hands often with soap and warm water for at least 20 seconds.  If soap and water are not readily available, use an alcohol-based hand sanitizer with at least 60% alcohol.  . If coughing or sneezing, cover your mouth and nose by coughing or sneezing into the elbow areas of your shirt or coat, into a tissue or into your sleeve (not your hands). . Avoid shaking hands with others and consider head nods or verbal greetings only. . Avoid touching your eyes, nose, or mouth with unwashed hands.  . Avoid close contact with people who are sick. . Avoid places or events with large numbers of people in one location, like concerts or sporting events. . Carefully consider travel plans you have or are making. . If you are planning any travel outside or inside the [place], visit the CDC's Travelers' Health webpage for the latest health notices. . If you have some symptoms but not all symptoms, continue to monitor at home and seek medical attention if your symptoms worsen. . If you are having a medical emergency, call 911.   [place] / e-Visit: [email or web address]         [facility]: [facility]: [facility]: [phone]

## 2018-10-02 ENCOUNTER — Ambulatory Visit (INDEPENDENT_AMBULATORY_CARE_PROVIDER_SITE_OTHER): Payer: Managed Care, Other (non HMO) | Admitting: Obstetrics and Gynecology

## 2018-10-02 ENCOUNTER — Encounter: Payer: Self-pay | Admitting: Obstetrics and Gynecology

## 2018-10-02 ENCOUNTER — Other Ambulatory Visit: Payer: Self-pay

## 2018-10-02 VITALS — BP 142/78 | HR 80 | Ht 63.0 in | Wt 253.9 lb

## 2018-10-02 DIAGNOSIS — N840 Polyp of corpus uteri: Secondary | ICD-10-CM

## 2018-10-02 DIAGNOSIS — N950 Postmenopausal bleeding: Secondary | ICD-10-CM | POA: Diagnosis not present

## 2018-10-02 NOTE — Progress Notes (Signed)
    GYNECOLOGY PROGRESS NOTE  Subjective:    Patient ID: Tammy Webb, female    DOB: 1955/08/12, 63 y.o.   MRN: [id number]  HPI  Patient is a 63 y.o. G68P1020 female who presents for discussion of surgery. She has had PMB, and underwent an endometrial biopsy in March.  Biopsy results note an endometrial polyp. Denies complaints today.   The following portions of the patient's history were reviewed and updated as appropriate: allergies, current medications, past family history, past medical history, past social history, past surgical history and problem list.  Review of Systems Pertinent items noted in HPI and remainder of comprehensive ROS otherwise negative.   Objective:   Blood pressure (!) 142/78, pulse 80, height 5\' 3"  (1.6 m), weight 253 lb 14.4 oz (115.2 kg). General appearance: alert and no distress Abdomen: soft, non-tender; bowel sounds normal; no masses,  no organomegaly Remainder of exam deferred.    Imaging:  US PELVIS TRANSVANGINAL NON-OB (TV ONLY) Patient Name: Tammy Webb DOB: Jul 10, 1955 MRN: [id number] ULTRASOUND REPORT  Location: [facility]  Date of Service: 06/28/2018   Indications:Enlarged Uterus Findings:  The uterus is retroverted and measures 6.0 x 4.1 x 5.0 cm. Echo texture is heterogenous with evidence of focal masses. Within the uterus are multiple suspected fibroids measuring: Fibroid 1:anterior IM 1.8x1.5x1.4 cm Fibroid 2:posterior IM 1.7x1.7x1.5cm  The Endometrium measures 12.3 mm.  Right Ovary measures 2.8x1.4x2.5 cm. It is normal in appearance. Left Ovary measures 2.6x1.6x2.4 cm. It is normal in appearance. Survey of the adnexa demonstrates no adnexal masses. There is no free fluid in the cul de sac.  Impression: 1.   Recommendations: 1.Clinical correlation with the patient's History and Physical Exam. 2. Retroverted UT with multiple focal fibroids. 3. Endometrium is upper limits = 12.3 mm  Lillia Dallas, RDMS   I have  reviewed this study and agree with documented findings.   Rubie Maid, MD [facility]    Pathology: Diagnosis:  ENDOMETRIUM, BIOPSY:  FRAGMENTS OF BENIGN ENDOMETRIAL POLYP, AND INACTIVE ENDOMETRIUM. NO  HYPERPLASIA OR CARCINOMA.   Assessment:   PMB Endometrial polyp  Plan:   Discussion had regarding endometrial biopsy findings. Patient with benign endometrial polyp.  In the setting of postmenopausal bleeding, it is recommended for polyp removal.  This can be performed by Hysteroscopy D&C with polypectomy. Discussed nature of procedure, including risks and benefits. Advised that the likelihood of resolution of her symptoms was high. All questions answered. Patient notes understanding.  She is ok with recommended procedure and will plan to schedule surgery for October 15, 2018.  Pre-op done today.  Given pre-operative instructions.    A total of 15 minutes were spent face-to-face with the patient during this encounter and over half of that time dealt with counseling and coordination of care.   Rubie Maid, MD [facility]

## 2018-10-02 NOTE — H&P (View-Only) (Signed)
    GYNECOLOGY PROGRESS NOTE  Subjective:    Patient ID: Tammy Webb, female    DOB: 1955/06/26, 63 y.o.   MRN: [id number]  HPI  Patient is a 63 y.o. G34P1020 female who presents for discussion of surgery. She has had PMB, and underwent an endometrial biopsy in March.  Biopsy results note an endometrial polyp. Denies complaints today.   The following portions of the patient's history were reviewed and updated as appropriate: allergies, current medications, past family history, past medical history, past social history, past surgical history and problem list.  Review of Systems Pertinent items noted in HPI and remainder of comprehensive ROS otherwise negative.   Objective:   Blood pressure (!) 142/78, pulse 80, height 5\' 3"  (1.6 m), weight 253 lb 14.4 oz (115.2 kg). General appearance: alert and no distress Abdomen: soft, non-tender; bowel sounds normal; no masses,  no organomegaly Remainder of exam deferred.    Imaging:  US PELVIS TRANSVANGINAL NON-OB (TV ONLY) Patient Name: Tammy Webb DOB: 02/09/1956 MRN: [id number] ULTRASOUND REPORT  Location: [facility]  Date of Service: 06/28/2018   Indications:Enlarged Uterus Findings:  The uterus is retroverted and measures 6.0 x 4.1 x 5.0 cm. Echo texture is heterogenous with evidence of focal masses. Within the uterus are multiple suspected fibroids measuring: Fibroid 1:anterior IM 1.8x1.5x1.4 cm Fibroid 2:posterior IM 1.7x1.7x1.5cm  The Endometrium measures 12.3 mm.  Right Ovary measures 2.8x1.4x2.5 cm. It is normal in appearance. Left Ovary measures 2.6x1.6x2.4 cm. It is normal in appearance. Survey of the adnexa demonstrates no adnexal masses. There is no free fluid in the cul de sac.  Impression: 1.   Recommendations: 1.Clinical correlation with the patient's History and Physical Exam. 2. Retroverted UT with multiple focal fibroids. 3. Endometrium is upper limits = 12.3 mm  Lillia Dallas, RDMS   I have  reviewed this study and agree with documented findings.   Rubie Maid, MD [facility]    Pathology: Diagnosis:  ENDOMETRIUM, BIOPSY:  FRAGMENTS OF BENIGN ENDOMETRIAL POLYP, AND INACTIVE ENDOMETRIUM. NO  HYPERPLASIA OR CARCINOMA.   Assessment:   PMB Endometrial polyp  Plan:   Discussion had regarding endometrial biopsy findings. Patient with benign endometrial polyp.  In the setting of postmenopausal bleeding, it is recommended for polyp removal.  This can be performed by Hysteroscopy D&C with polypectomy. Discussed nature of procedure, including risks and benefits. Advised that the likelihood of resolution of her symptoms was high. All questions answered. Patient notes understanding.  She is ok with recommended procedure and will plan to schedule surgery for October 15, 2018.  Pre-op done today.  Given pre-operative instructions.    A total of 15 minutes were spent face-to-face with the patient during this encounter and over half of that time dealt with counseling and coordination of care.   Rubie Maid, MD [facility]

## 2018-10-02 NOTE — Progress Notes (Signed)
Pt is present for surgery discussion. Pt stated that she was doing well and just want to speak more about finishing the surgery she had started before covid19.

## 2018-10-02 NOTE — Patient Instructions (Signed)
Hysteroscopy Hysteroscopy is a procedure that is used to examine the inside of a woman's womb (uterus). This may be done for various reasons, including:  To look for lumps (tumors) and other growths in the uterus.  To evaluate abnormal bleeding, fibroid tumors, polyps, scar tissue (adhesions), or cancer of the uterus.  To determine the cause of an inability to get pregnant (infertility) or repeated losses of pregnancies (miscarriages).  To find a lost IUD (intrauterine device).  To perform a procedure that permanently prevents pregnancy (sterilization). During this procedure, a thin, flexible tube with a small light and camera (hysteroscope) is used to examine the uterus. The camera sends images to a monitor in the room so that your health care provider can view the inside of your uterus. A hysteroscopy should be done right after a menstrual period to make sure that you are not pregnant. Tell a health care provider about:  Any allergies you have.  All medicines you are taking, including vitamins, herbs, eye drops, creams, and over-the-counter medicines.  Any problems you or family members have had with the use of anesthetic medicines.  Any blood disorders you have.  Any surgeries you have had.  Any medical conditions you have.  Whether you are pregnant or may be pregnant. What are the risks? Generally, this is a safe procedure. However, problems may occur, including:  Excessive bleeding.  Infection.  Damage to the uterus or other structures or organs.  Allergic reaction to medicines or fluids that are used in the procedure. What happens before the procedure? Staying hydrated Follow instructions from your health care provider about hydration, which may include:  Up to 2 hours before the procedure - you may continue to drink clear liquids, such as water, clear fruit juice, black coffee, and plain tea. Eating and drinking restrictions Follow instructions from your health care  provider about eating and drinking, which may include:  8 hours before the procedure - stop eating solid foods and drink clear liquids only  2 hours before the procedure - stop drinking clear liquids. General instructions  Ask your health care provider about: ? Changing or stopping your normal medicines. This is important if you take diabetes medicines or blood thinners. ? Taking medicines such as aspirin and ibuprofen. These medicines can thin your blood and cause bleeding. Do not take these medicines for 1 week before your procedure, or as told by your health care provider.  Do not use any products that contain nicotine or tobacco for 2 weeks before the procedure. This includes cigarettes and e-cigarettes. If you need help quitting, ask your health care provider.  Medicine may be placed in your cervix the day before the procedure. This medicine causes the cervix to have a larger opening (dilate). The larger opening makes it easier for the hysteroscope to be inserted into the uterus during the procedure.  Plan to have someone with you for the first 24-48 hours after the procedure, especially if you are given a medicine to make you fall asleep (general anesthetic).  Plan to have someone take you home from the hospital or clinic. What happens during the procedure?  To lower your risk of infection: ? Your health care team will wash or sanitize their hands. ? Your skin will be washed with soap. ? Hair may be removed from the surgical area.  An IV tube will be inserted into one of your veins.  You may be given one or more of the following: ? A medicine to help  you relax (sedative). ? A medicine that numbs the area around the cervix (local anesthetic). ? A medicine to make you fall asleep (general anesthetic).  A hysteroscope will be inserted through your vagina and into your uterus.  Air or fluid will be used to enlarge your uterus, enabling your health care provider to see your uterus  better. The amount of fluid used will be carefully checked throughout the procedure.  In some cases, tissue may be gently scraped from inside the uterus and sent to a lab for testing (biopsy). The procedure may vary among health care providers and hospitals. What happens after the procedure?  Your blood pressure, heart rate, breathing rate, and blood oxygen level will be monitored until the medicines you were given have worn off.  You may have some cramping. You may be given medicines for this.  You may have bleeding, which varies from light spotting to menstrual-like bleeding. This is normal.  If you had a biopsy done, it is your responsibility to get the results of your procedure. Ask your health care provider, or the department performing the procedure, when your results will be ready. Summary  Hysteroscopy is a procedure that is used to examine the inside of a woman's womb (uterus).  After the procedure, you may have bleeding, which varies from light spotting to menstrual-like bleeding. This is normal. You may also have cramping.  Plan to have someone take you home from the hospital or clinic. This information is not intended to replace advice given to you by your health care provider. Make sure you discuss any questions you have with your health care provider. Document Released: 06/27/2000 Document Revised: 03/03/2017 Document Reviewed: 04/19/2016 Elsevier Patient Education  2020 [employer].
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
## 2018-10-11 ENCOUNTER — Other Ambulatory Visit: Payer: Self-pay | Admitting: Obstetrics and Gynecology

## 2018-10-11 ENCOUNTER — Other Ambulatory Visit: Admission: RE | Admit: 2018-10-11 | Payer: Managed Care, Other (non HMO) | Source: Ambulatory Visit

## 2018-10-11 ENCOUNTER — Other Ambulatory Visit: Payer: Self-pay

## 2018-10-11 ENCOUNTER — Encounter
Admission: RE | Admit: 2018-10-11 | Discharge: 2018-10-11 | Disposition: A | Discharge status: Home / Self Care | Payer: Managed Care, Other (non HMO) | Source: Ambulatory Visit | Attending: Obstetrics and Gynecology | Admitting: Obstetrics and Gynecology

## 2018-10-11 DIAGNOSIS — Z1159 Encounter for screening for other viral diseases: Secondary | ICD-10-CM | POA: Insufficient documentation

## 2018-10-11 DIAGNOSIS — R0602 Shortness of breath: Secondary | ICD-10-CM | POA: Diagnosis not present

## 2018-10-11 DIAGNOSIS — Z01818 Encounter for other preprocedural examination: Secondary | ICD-10-CM | POA: Insufficient documentation

## 2018-10-11 HISTORY — DX: Anxiety disorder, unspecified: F41.9

## 2018-10-11 HISTORY — DX: Type 2 diabetes mellitus without complications: E11.9

## 2018-10-11 LAB — BASIC METABOLIC PANEL
Anion gap: 7 (ref 5–15)
BUN: 18 mg/dL (ref 8–23)
CO2: 26 mmol/L (ref 22–32)
Calcium: 9 mg/dL (ref 8.9–10.3)
Chloride: 108 mmol/L (ref 98–111)
Creatinine, Ser: 0.97 mg/dL (ref 0.44–1.00)
GFR calc Af Amer: >60 mL/min (ref >60)
GFR calc non Af Amer: >60 mL/min (ref >60)
Glucose, Bld: 122 mg/dL — ABNORMAL HIGH (ref 70–99)
Potassium: 3.7 mmol/L (ref 3.5–5.1)
Sodium: 141 mmol/L (ref 135–145)

## 2018-10-11 LAB — CBC
HCT: 38.1 % (ref 36.0–46.0)
Hemoglobin: 12.6 g/dL (ref 12.0–15.0)
MCH: 29.9 pg (ref 26.0–34.0)
MCHC: 33.1 g/dL (ref 30.0–36.0)
MCV: 90.3 fL (ref 80.0–100.0)
Platelets: 289 10*3/uL (ref 150–400)
RBC: 4.22 MIL/uL (ref 3.87–5.11)
RDW: 13.1 % (ref 11.5–15.5)
WBC: 9.4 10*3/uL (ref 4.0–10.5)
nRBC: 0 % (ref 0.0–0.2)

## 2018-10-11 NOTE — Patient Instructions (Signed)
Your procedure is scheduled on: 10/15/2018 Mon Report to Same Day Surgery 2nd floor medical mall [facility] Entrance-take elevator on left to 2nd floor.  Check in with surgery information desk.) To find out your arrival time please call [phone] between 1PM - 3PM on 10/12/2018 Fri  Remember: Instructions that are not followed completely may result in serious medical risk, up to and including death, or upon the discretion of your surgeon and anesthesiologist your surgery may need to be rescheduled.    _x___ 1. Do not eat food after midnight the night before your procedure. You may drink clear liquids up to 2 hours before you are scheduled to arrive at the hospital for your procedure.  Do not drink clear liquids within 2 hours of your scheduled arrival to the hospital.  Clear liquids include  --Water or Apple juice without pulp  --Clear carbohydrate beverage such as ClearFast or Gatorade  --Black Coffee or Clear Tea (No milk, no creamers, do not add anything to                  the coffee or Tea Type 1 and type 2 diabetics should only drink water.   ____Ensure clear carbohydrate drink on the way to the hospital for bariatric patients  ____Ensure clear carbohydrate drink 3 hours before surgery for Dr Dwyane Luo patients if physician instructed.   No gum chewing or hard candies.     __x__ 2. No Alcohol for 24 hours before or after surgery.   __x__3. No Smoking or e-cigarettes for 24 prior to surgery.  Do not use any chewable tobacco products for at least 6 hour prior to surgery   ____  4. Bring all medications with you on the day of surgery if instructed.    __x__ 5. Notify your doctor if there is any change in your medical condition     (cold, fever, infections).    x___6. On the morning of surgery brush your teeth with toothpaste and water.  You may rinse your mouth with mouth wash if you wish.  Do not swallow any toothpaste or mouthwash.   Do not wear jewelry, make-up, hairpins,  clips or nail polish.  Do not wear lotions, powders, or perfumes. You may wear deodorant.  Do not shave 48 hours prior to surgery. Men may shave face and neck.  Do not bring valuables to the hospital.    [facility] is not responsible for any belongings or valuables.               Contacts, dentures or bridgework may not be worn into surgery.  Leave your suitcase in the car. After surgery it may be brought to your room.  For patients admitted to the hospital, discharge time is determined by your                       treatment team.  _  Patients discharged the day of surgery will not be allowed to drive home.  You will need someone to drive you home and stay with you the night of your procedure.    Please read over the following fact sheets that you were given:   [facility] Preparing for Surgery and or MRSA Information   _x___ Take anti-hypertensive listed below, cardiac, seizure, asthma,     anti-reflux and psychiatric medicines. These include:  1. none  2.  3.  4.  5.  6.  ____Fleets enema or Magnesium Citrate as directed.  _x___ Use CHG Soap or sage wipes as directed on instruction sheet   ____ Use inhalers on the day of surgery and bring to hospital day of surgery  ____ Stop Metformin and Janumet 2 days prior to surgery.    ____ Take 1/2 of usual insulin dose the night before surgery and none on the morning     surgery.   _x___ Follow recommendations from Cardiologist, Pulmonologist or PCP regarding          stopping Aspirin, Coumadin, Plavix ,Eliquis, Effient, or Pradaxa, and Pletal.  X____Stop Anti-inflammatories such as Advil, Aleve, Ibuprofen, Motrin, Naproxen, Naprosyn, Goodies powders or aspirin products. OK to take Tylenol and                          Celebrex.   _x___ Stop supplements until after surgery.  But may continue Vitamin D, Vitamin B,       and multivitamin.   ____ Bring C-Pap to the hospital.

## 2018-10-12 LAB — SARS CORONAVIRUS 2 (TAT 6-24 HRS): SARS Coronavirus 2: NEGATIVE

## 2018-10-15 ENCOUNTER — Ambulatory Visit: Payer: Managed Care, Other (non HMO) | Admitting: Anesthesiology

## 2018-10-15 ENCOUNTER — Other Ambulatory Visit: Payer: Self-pay

## 2018-10-15 ENCOUNTER — Encounter: Payer: Self-pay | Admitting: *Deleted

## 2018-10-15 ENCOUNTER — Ambulatory Visit
Admission: RE | Admit: 2018-10-15 | Discharge: 2018-10-15 | Disposition: A | Discharge status: Home / Self Care | Payer: Managed Care, Other (non HMO) | Attending: Obstetrics and Gynecology | Admitting: Obstetrics and Gynecology

## 2018-10-15 ENCOUNTER — Encounter
Admission: RE | Disposition: A | Discharge status: Home / Self Care | Payer: Self-pay | Source: Home / Self Care | Attending: Obstetrics and Gynecology

## 2018-10-15 DIAGNOSIS — G4730 Sleep apnea, unspecified: Secondary | ICD-10-CM | POA: Insufficient documentation

## 2018-10-15 DIAGNOSIS — E119 Type 2 diabetes mellitus without complications: Secondary | ICD-10-CM | POA: Diagnosis not present

## 2018-10-15 DIAGNOSIS — N950 Postmenopausal bleeding: Secondary | ICD-10-CM

## 2018-10-15 DIAGNOSIS — I10 Essential (primary) hypertension: Secondary | ICD-10-CM | POA: Diagnosis not present

## 2018-10-15 DIAGNOSIS — N840 Polyp of corpus uteri: Secondary | ICD-10-CM

## 2018-10-15 HISTORY — PX: HYSTEROSCOPY WITH D & C: SHX1775

## 2018-10-15 LAB — GLUCOSE, CAPILLARY
Glucose-Capillary: 114 mg/dL — ABNORMAL HIGH (ref 70–99)
Glucose-Capillary: 104 mg/dL — ABNORMAL HIGH (ref 70–99)

## 2018-10-15 SURGERY — DILATATION AND CURETTAGE /HYSTEROSCOPY
Anesthesia: General | Site: Vagina

## 2018-10-15 MED ORDER — IBUPROFEN 800 MG PO TABS: 800.0000 mg | ORAL_TABLET | Freq: Three times a day (TID) | ORAL | 1 refills | Status: AC | PRN

## 2018-10-15 MED ORDER — FENTANYL CITRATE (PF) 100 MCG/2ML IJ SOLN
INTRAMUSCULAR | Status: DC | PRN
  Administered 2018-10-15: 100 ug via INTRAVENOUS

## 2018-10-15 MED ORDER — MIDAZOLAM HCL 2 MG/2ML IJ SOLN
INTRAMUSCULAR | Status: DC | PRN
  Administered 2018-10-15: 2 mg via INTRAVENOUS

## 2018-10-15 MED ORDER — ROCURONIUM BROMIDE 100 MG/10ML IV SOLN
INTRAVENOUS | Status: DC | PRN
  Administered 2018-10-15: 5 mg via INTRAVENOUS

## 2018-10-15 MED ORDER — FENTANYL CITRATE (PF) 100 MCG/2ML IJ SOLN
INTRAMUSCULAR | Status: AC
  Filled 2018-10-15: qty 2

## 2018-10-15 MED ORDER — ONDANSETRON HCL 4 MG/2ML IJ SOLN: 4.0000 mg | Freq: Once | INTRAMUSCULAR | Status: DC | PRN

## 2018-10-15 MED ORDER — ONDANSETRON HCL 4 MG/2ML IJ SOLN
INTRAMUSCULAR | Status: DC | PRN
  Administered 2018-10-15: 4 mg via INTRAVENOUS

## 2018-10-15 MED ORDER — DIPHENHYDRAMINE HCL 50 MG/ML IJ SOLN
INTRAMUSCULAR | Status: DC | PRN
  Administered 2018-10-15: 12.5 mg via INTRAVENOUS

## 2018-10-15 MED ORDER — FENTANYL CITRATE (PF) 100 MCG/2ML IJ SOLN
INTRAMUSCULAR | Status: AC
  Administered 2018-10-15: 25 ug via INTRAVENOUS
  Filled 2018-10-15: qty 2

## 2018-10-15 MED ORDER — SUGAMMADEX SODIUM 200 MG/2ML IV SOLN
INTRAVENOUS | Status: AC
  Filled 2018-10-15: qty 2

## 2018-10-15 MED ORDER — DIPHENHYDRAMINE HCL 50 MG/ML IJ SOLN
INTRAMUSCULAR | Status: AC
  Filled 2018-10-15: qty 1

## 2018-10-15 MED ORDER — SUCCINYLCHOLINE CHLORIDE 20 MG/ML IJ SOLN
INTRAMUSCULAR | Status: DC | PRN
  Administered 2018-10-15: 100 mg via INTRAVENOUS

## 2018-10-15 MED ORDER — MIDAZOLAM HCL 2 MG/2ML IJ SOLN
INTRAMUSCULAR | Status: AC
  Filled 2018-10-15: qty 2

## 2018-10-15 MED ORDER — LIDOCAINE HCL (PF) 2 % IJ SOLN
INTRAMUSCULAR | Status: AC
  Filled 2018-10-15: qty 10

## 2018-10-15 MED ORDER — KETOROLAC TROMETHAMINE 30 MG/ML IJ SOLN
INTRAMUSCULAR | Status: AC
  Filled 2018-10-15: qty 1

## 2018-10-15 MED ORDER — FAMOTIDINE 20 MG PO TABS
ORAL_TABLET | ORAL | Status: AC
  Administered 2018-10-15: 07:00:00
  Filled 2018-10-15: qty 1

## 2018-10-15 MED ORDER — FENTANYL CITRATE (PF) 100 MCG/2ML IJ SOLN
25.0000 ug | INTRAMUSCULAR | Status: DC | PRN
  Administered 2018-10-15 (×2): 25 ug via INTRAVENOUS

## 2018-10-15 MED ORDER — PROPOFOL 10 MG/ML IV BOLUS
INTRAVENOUS | Status: DC | PRN
  Administered 2018-10-15: 150 mg via INTRAVENOUS

## 2018-10-15 MED ORDER — GLYCOPYRROLATE 0.2 MG/ML IJ SOLN
INTRAMUSCULAR | Status: DC | PRN
  Administered 2018-10-15: 0.2 mg via INTRAVENOUS

## 2018-10-15 MED ORDER — SODIUM CHLORIDE 0.9 % IV SOLN
INTRAVENOUS | Status: DC
  Administered 2018-10-15: 08:00:00 via INTRAVENOUS

## 2018-10-15 MED ORDER — LIDOCAINE HCL (CARDIAC) PF 100 MG/5ML IV SOSY
PREFILLED_SYRINGE | INTRAVENOUS | Status: DC | PRN
  Administered 2018-10-15: 80 mg via INTRAVENOUS

## 2018-10-15 MED ORDER — KETOROLAC TROMETHAMINE 30 MG/ML IJ SOLN
INTRAMUSCULAR | Status: DC | PRN
  Administered 2018-10-15: 30 mg via INTRAVENOUS

## 2018-10-15 MED ORDER — ROCURONIUM BROMIDE 50 MG/5ML IV SOLN
INTRAVENOUS | Status: AC
  Filled 2018-10-15: qty 1

## 2018-10-15 MED ORDER — SUGAMMADEX SODIUM 200 MG/2ML IV SOLN
INTRAVENOUS | Status: DC | PRN
  Administered 2018-10-15: 50 mg via INTRAVENOUS

## 2018-10-15 MED ORDER — FAMOTIDINE 20 MG PO TABS: 20.0000 mg | ORAL_TABLET | Freq: Once | ORAL | Status: DC

## 2018-10-15 MED ORDER — ONDANSETRON HCL 4 MG/2ML IJ SOLN
INTRAMUSCULAR | Status: AC
  Filled 2018-10-15: qty 2

## 2018-10-15 MED ORDER — PROPOFOL 10 MG/ML IV BOLUS
INTRAVENOUS | Status: AC
  Filled 2018-10-15: qty 20

## 2018-10-15 MED ORDER — SUCCINYLCHOLINE CHLORIDE 20 MG/ML IJ SOLN
INTRAMUSCULAR | Status: AC
  Filled 2018-10-15: qty 1

## 2018-10-15 MED ORDER — LACTATED RINGERS IV SOLN: INTRAVENOUS | Status: DC

## 2018-10-15 SURGICAL SUPPLY — 20 items
BAG INFUSER PRESSURE 100CC (MISCELLANEOUS) ×2 IMPLANT
CATH ROBINSON RED A/P 16FR (CATHETERS) ×2 IMPLANT
COVER WAND RF STERILE (DRAPES) IMPLANT
GLOVE BIO SURGEON STRL SZ 6.5 (GLOVE) ×2 IMPLANT
GLOVE INDICATOR 7.0 STRL GRN (GLOVE) ×2 IMPLANT
GOWN STRL REUS W/ TWL LRG LVL3 (GOWN DISPOSABLE) ×2 IMPLANT
GOWN STRL REUS W/TWL LRG LVL3 (GOWN DISPOSABLE) ×2
IV LACTATED RINGER IRRG 3000ML (IV SOLUTION) ×1
IV LR IRRIG 3000ML ARTHROMATIC (IV SOLUTION) ×1 IMPLANT
KIT PROCEDURE FLUENT (KITS) ×4 IMPLANT
KIT TURNOVER CYSTO (KITS) ×2 IMPLANT
MYOSURE LITE POLYP REMOVAL (MISCELLANEOUS) ×2 IMPLANT
PACK DNC HYST (MISCELLANEOUS) ×2 IMPLANT
PAD OB MATERNITY 4.3X12.25 (PERSONAL CARE ITEMS) ×2 IMPLANT
PAD PREP 24X41 OB/GYN DISP (PERSONAL CARE ITEMS) ×2 IMPLANT
SOL PREP PVP 2OZ (MISCELLANEOUS) ×2
SOLUTION PREP PVP 2OZ (MISCELLANEOUS) ×1 IMPLANT
SURGILUBE 2OZ TUBE FLIPTOP (MISCELLANEOUS) ×2 IMPLANT
TOWEL OR 17X26 4PK STRL BLUE (TOWEL DISPOSABLE) ×4 IMPLANT
TUBING CONNECTING 10 (TUBING) ×4 IMPLANT

## 2018-10-15 NOTE — Interval H&P Note (Signed)
History and Physical Interval Note:  10/15/2018 7:22 AM  Tammy Webb  has presented today for surgery, with the diagnosis of ENDOMETRIAL POLYP.  The various methods of treatment have been discussed with the patient and family. After consideration of risks, benefits and other options for treatment, the patient has consented to  Procedure(s): DILATATION AND CURETTAGE /HYSTEROSCOPY WITH POLYPECTOMY (N/A) as a surgical intervention.  The patient's history has been reviewed, patient examined, no change in status, stable for surgery.  I have reviewed the patient's chart and labs.  Questions were answered to the patient's satisfaction.     Rubie Maid. MD [facility]

## 2018-10-15 NOTE — Transfer of Care (Signed)
Immediate Anesthesia Transfer of Care Note  Patient: Tammy Webb  Procedure(s) Performed: DILATATION AND CURETTAGE /HYSTEROSCOPY WITH POLYPECTOMY (N/A Vagina )  Patient Location: PACU  Anesthesia Type:General  Level of Consciousness: drowsy and patient cooperative  Airway & Oxygen Therapy: Patient Spontanous Breathing and Patient connected to face mask oxygen  Post-op Assessment: Report given to RN and Post -op Vital signs reviewed and stable  Post vital signs: Reviewed and stable  Last Vitals:  Vitals Value Taken Time  BP 158/68 10/15/18 0904  Temp 35.9 C 10/15/18 0904  Pulse 58 10/15/18 0907  Resp 11 10/15/18 0907  SpO2 100 % 10/15/18 0907  Vitals shown include unvalidated device data.  Last Pain:  Vitals:   10/15/18 0904  TempSrc:   PainSc: Asleep         Complications: No apparent anesthesia complications

## 2018-10-15 NOTE — Anesthesia Post-op Follow-up Note (Signed)
Anesthesia QCDR form completed.        

## 2018-10-15 NOTE — Op Note (Signed)
Procedure(s): DILATATION AND CURETTAGE /HYSTEROSCOPY WITH POLYPECTOMY Procedure Note  Tammy Webb female 63 y.o. 10/15/2018  Indications: The patient is a 63 y.o. [id number] female with   Pre-operative Diagnosis:   Post-operative Diagnosis:   Surgeon: Rubie Maid, MD  Assistants:  Marin Roberts, [facility] [employer] [facility] PA student.   Anesthesia: General endotracheal anesthesia  Findings:Uterus sounded to 8.5 cm.   Atrophic endometrium with small polyp noted at fundus near right tubal ostium.  Tubal ostia were visualized bilaterally.   Procedure Details: The patient was seen in the Holding Room. The risks, benefits, complications, treatment options, and expected outcomes were discussed with the patient.  The patient concurred with the proposed plan, giving informed consent.  The site of surgery properly noted/marked. The patient was taken to the Operating Room, identified as Tammy Webb and the procedure verified as Procedure(s) (LRB): DILATATION AND CURETTAGE /HYSTEROSCOPY WITH POLYPECTOMY (N/A). A Time Out was held and the above information confirmed.  The patient was then placed under general anesthesia without difficulty.  She was then prepped and draped in the normal sterile fashion, and placed in the dorsal lithotomy position.  A time out was performed.  An exam under anesthesia was performed with the findings noted above.  Straight catheterization was performed. A sterile speculum was inserted into vagina. A single-tooth tenaculum was used to grasp the anterior lip of the cervix. Cervical dilation was performed. A 5 mm hysteroscope was introduced into the uterus under direct visualization. The cavity was allowed to fill, and then the entire cavity was explored with the findings described above. The hysteroscope was removed, . A polyp forceps was then used to attempt to grasp the polyp. Next, a sharp curette was then passed into the uterus and endometrial sampling was collected for pathology. The  hysteroscope was reinserted with confirmation that the endometrial polyp had been removed.  The hysteroscope was removed from the patient's uterine cavity. The tenaculum was removed and excellent hemostasis was noted. The speculum was removed from the vagina.   All instrument and sponge counts were correct at the end of the procedure x 2.  The patient tolerated the procedure well.  She was awakened and taken to the PACU in stable condition.    Estimated Blood Loss:  minimal ml      Drains: straight catheterization with 200 cc at start of procedure.          Total IV Fluids:  500 ml  Specimens:  Endometrial curettings with endometrial polyp         Implants: None         Complications:  None; patient tolerated the procedure well.         Disposition: PACU - hemodynamically stable.         Condition: stable   Rubie Maid, MD [facility]

## 2018-10-15 NOTE — Anesthesia Postprocedure Evaluation (Signed)
Anesthesia Post Note  Patient: Tammy Webb  Procedure(s) Performed: DILATATION AND CURETTAGE /HYSTEROSCOPY WITH POLYPECTOMY (N/A Vagina )  Patient location during evaluation: PACU Anesthesia Type: General Level of consciousness: awake and alert Pain management: pain level controlled Vital Signs Assessment: post-procedure vital signs reviewed and stable Respiratory status: spontaneous breathing and respiratory function stable Cardiovascular status: stable Anesthetic complications: no     Last Vitals:  Vitals:   10/15/18 0653 10/15/18 0904  BP: (!) 150/69 (!) 158/68  Pulse: 63 (!) 59  Resp: 16 13  Temp: (!) 36.2 C (!) 35.9 C  SpO2: 96% 100%    Last Pain:  Vitals:   10/15/18 0904  TempSrc:   PainSc: Asleep                 Clarie Camey K

## 2018-10-15 NOTE — Anesthesia Procedure Notes (Signed)
Procedure Name: Intubation Date/Time: 10/15/2018 7:44 AM Performed by: Jonna Clark, CRNA Pre-anesthesia Checklist: Patient identified, Patient being monitored, Timeout performed, Emergency Drugs available and Suction available Patient Re-evaluated:Patient Re-evaluated prior to induction Oxygen Delivery Method: Circle system utilized Preoxygenation: Pre-oxygenation with 100% oxygen Induction Type: IV induction Ventilation: Mask ventilation without difficulty Laryngoscope Size: Mac and 3 Grade View: Grade I Tube type: Oral Tube size: 7.0 mm Number of attempts: 1 Airway Equipment and Method: Stylet Placement Confirmation: ETT inserted through vocal cords under direct vision,  positive ETCO2 and breath sounds checked- equal and bilateral Secured at: 21 cm Tube secured with: Tape Dental Injury: Teeth and Oropharynx as per pre-operative assessment

## 2018-10-15 NOTE — Anesthesia Preprocedure Evaluation (Signed)
Anesthesia Evaluation  Patient identified by MRN, date of birth, ID band Patient awake    Reviewed: Allergy & Precautions, NPO status , Patient's Chart, lab work & pertinent test results  History of Anesthesia Complications Negative for: history of anesthetic complications  Airway Mallampati: III       Dental   Pulmonary sleep apnea and Continuous Positive Airway Pressure Ventilation , neg COPD,           Cardiovascular hypertension, Pt. on medications (-) Past MI and (-) CHF (-) dysrhythmias (-) Valvular Problems/Murmurs     Neuro/Psych neg Seizures Anxiety Depression    GI/Hepatic Neg liver ROS, GERD  Medicated and Poorly Controlled,  Endo/Other  diabetes, Type 2, Oral Hypoglycemic Agents  Renal/GU Renal disease     Musculoskeletal   Abdominal   Peds  Hematology   Anesthesia Other Findings   Reproductive/Obstetrics                             Anesthesia Physical Anesthesia Plan  ASA: III  Anesthesia Plan: General   Post-op Pain Management:    Induction: Intravenous  PONV Risk Score and Plan: 3 and Dexamethasone, Ondansetron and Midazolam  Airway Management Planned: Oral ETT  Additional Equipment:   Intra-op Plan:   Post-operative Plan:   Informed Consent: I have reviewed the patients History and Physical, chart, labs and discussed the procedure including the risks, benefits and alternatives for the proposed anesthesia with the patient or authorized representative who has indicated his/her understanding and acceptance.       Plan Discussed with:   Anesthesia Plan Comments:         Anesthesia Quick Evaluation

## 2018-10-15 NOTE — Discharge Instructions (Signed)

## 2018-10-16 LAB — SURGICAL PATHOLOGY

## 2018-10-24 ENCOUNTER — Ambulatory Visit (INDEPENDENT_AMBULATORY_CARE_PROVIDER_SITE_OTHER): Payer: Managed Care, Other (non HMO) | Admitting: Obstetrics and Gynecology

## 2018-10-24 ENCOUNTER — Other Ambulatory Visit: Payer: Self-pay

## 2018-10-24 ENCOUNTER — Encounter: Payer: Self-pay | Admitting: Obstetrics and Gynecology

## 2018-10-24 VITALS — BP 124/66 | HR 80 | Ht 62.5 in | Wt 250.8 lb

## 2018-10-24 DIAGNOSIS — Z9889 Other specified postprocedural states: Secondary | ICD-10-CM

## 2018-10-24 NOTE — Progress Notes (Signed)
    OBSTETRICS/GYNECOLOGY POST-OPERATIVE CLINIC VISIT  Subjective:     Tammy Webb is a 63 y.o. female who presents to the clinic 1 weeks status post hysteroscopic polypectomy and D&C for abnormal uterine bleeding and endometrial polyp. The patient is not having any pain. She denies any further bleeding.   The following portions of the patient's history were reviewed and updated as appropriate: allergies, current medications, past family history, past medical history, past social history, past surgical history and problem list.  Review of Systems A comprehensive review of systems was negative.    Objective:    BP 124/66   Pulse 80   Ht 5' 2.5" (1.588 m)   Wt 250 lb 12.8 oz (113.8 kg)   BMI 45.14 kg/m  General:  alert and no distress  Abdomen: soft, bowel sounds active, non-tender  Pelvis:   deferred    Pathology:  A. ENDOMETRIUM, CURETTAGE:  - BENIGN ENDOMETRIAL POLYP.  - NEGATIVE FOR ATYPIA/EIN AND MALIGNANCY.   Assessment:    Doing well postoperatively.   Plan:   1. Continue any current medications as needed. 2. Wound care discussed. 3. Operative findings again reviewed. Pathology report discussed. 4. Activity restrictions: none 5. Anticipated return to work: now. 6. Follow up: 3 months for annual exam    Rubie Maid, MD [facility]

## 2018-10-24 NOTE — Progress Notes (Signed)
Pt is present today for post op visit. Pt stated that she is doing well no problems.

## 2018-11-15 ENCOUNTER — Other Ambulatory Visit: Payer: Self-pay | Admitting: Cardiology

## 2018-11-15 DIAGNOSIS — R413 Other amnesia: Secondary | ICD-10-CM

## 2018-11-22 ENCOUNTER — Ambulatory Visit
Admission: RE | Admit: 2018-11-22 | Discharge: 2018-11-22 | Disposition: A | Discharge status: Home / Self Care | Payer: Managed Care, Other (non HMO) | Source: Ambulatory Visit | Attending: Cardiology | Admitting: Cardiology

## 2018-11-22 ENCOUNTER — Other Ambulatory Visit: Payer: Self-pay

## 2018-11-22 DIAGNOSIS — R413 Other amnesia: Secondary | ICD-10-CM | POA: Diagnosis not present

## 2018-11-22 MED ORDER — GADOBUTROL 1 MMOL/ML IV SOLN
10.0000 mL | Freq: Once | INTRAVENOUS | Status: AC | PRN
  Administered 2018-11-22: 10 mL via INTRAVENOUS

## 2019-02-01 ENCOUNTER — Encounter: Payer: Managed Care, Other (non HMO) | Admitting: Obstetrics and Gynecology

## 2019-02-19 MED ORDER — METOPROLOL SUCCINATE ER 25 MG PO TB24
25 MG | ORAL_TABLET | Freq: Every day | ORAL | 3 refills | Status: DC
Start: 2019-02-19 — End: 2020-02-14

## 2019-02-19 NOTE — Telephone Encounter (Signed)
Patient called requesting refill of Metoprolol 25 mg, 90 day supply to [facility] in [place].

## 2019-04-24 NOTE — Telephone Encounter (Signed)
Patient called and lmom.  She has a stent and is asking if she is a good candidate for covid vaccine?   Ok to [employer]

## 2019-04-25 NOTE — Telephone Encounter (Signed)
Called the patient and left a message that we do recommend that our patients get the Covid Vaccine.  Please sign in the the [facility] department and when it is your turn, they will let you know where the vaccine is available.

## 2019-05-16 NOTE — Telephone Encounter (Signed)
Returned call to Sally Wong and told her that she should get the vaccine. 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  She also said that her parents got the Covid.  Her mother is in a rehab center, but she learned this morning that her father passed away.

## 2019-05-16 NOTE — Telephone Encounter (Signed)
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   Patient states she has heard mixed answers on this on how long you have to wait after being positive.   She did try calling pcp and he is not available.

## 2019-08-08 ENCOUNTER — Encounter

## 2019-08-08 MED ORDER — ATORVASTATIN CALCIUM 40 MG PO TABS
40 MG | ORAL_TABLET | ORAL | 3 refills | Status: DC
Start: 2019-08-08 — End: 2020-07-14

## 2019-08-08 NOTE — Telephone Encounter (Signed)
Needs ov and labs

## 2019-08-09 NOTE — Telephone Encounter (Signed)
Mailed lab order to patient with note to please have done prior to appt on 09-05-19 that was already scheduled.

## 2019-08-17 ENCOUNTER — Encounter

## 2019-08-17 LAB — LIPID PANEL
Chol/HDL Ratio: 2 NA
Cholesterol: 124 mg/dL (ref <200)
HDL: 71 mg/dL — ABNORMAL HIGH (ref 40–60)
LDL Cholesterol: 44 mg/dL (ref <100)
Triglycerides: 44 mg/dL (ref <150)

## 2019-08-17 LAB — COMPREHENSIVE METABOLIC PANEL
ALT: 19 U/L (ref 0–34)
AST: 24 U/L (ref 15–46)
Albumin,Serum: 4.2 g/dL (ref 3.5–5.0)
Alkaline Phosphatase: 61 U/L (ref 38–126)
Anion Gap: 3 mmol/L (ref 3–13)
BUN: 15 mg/dL (ref 7–20)
CO2: 27 mmol/L (ref 22–30)
Calcium: 9.8 mg/dL (ref 8.4–10.4)
Chloride: 107 mmol/L (ref 98–107)
Creatinine: 0.68 mg/dL (ref 0.52–1.25)
EGFR IF NonAfrican American: >90 mL/min (ref >60)
Glucose: 94 mg/dL (ref 70–100)
Potassium: 4.5 mmol/L (ref 3.5–5.1)
Sodium: 137 mmol/L (ref 135–145)
Total Bilirubin: 0.7 mg/dL (ref 0.2–1.3)
Total Protein: 6.9 g/dL (ref 6.3–8.2)
eGFR African American: >90 mL/min (ref >60)

## 2019-08-20 NOTE — Telephone Encounter (Signed)
Called patient and left a message that all of her labs were normal and lipids were at goal.  She should continue her meds as prescribed and follow up as scheduled.

## 2019-08-20 NOTE — Telephone Encounter (Signed)
-----   Message from Armandina Stammer, MD sent at 08/19/2019  8:32 AM EDT -----  Reviewed results.  Please forward to patient if not already done.

## 2019-09-05 ENCOUNTER — Ambulatory Visit
Admit: 2019-09-05 | Discharge: 2019-09-05 | Payer: PRIVATE HEALTH INSURANCE | Attending: Cardiovascular Disease | Primary: Family Medicine

## 2019-09-05 DIAGNOSIS — I4891 Unspecified atrial fibrillation: Secondary | ICD-10-CM

## 2019-11-02 IMAGING — MR MRI HEAD WITHOUT AND WITH CONTRAST
14 series · 48 of 48 positions shown · IV contrast (10ml Gadavist)
Comparison: None.

CLINICAL DATA: Short-term memory loss. Headaches. Occasional
dizziness.

EXAM:
MRI HEAD WITHOUT AND WITH CONTRAST
TECHNIQUE: Multiplanar, multiecho pulse sequences of the brain and surrounding
structures were obtained without and with intravenous contrast.
CONTRAST:  10 mL Gadavist

[Series 5: ax dwi_tracew · axial · 3.0mm · 0.60mm/px · z∈[-96,+54]mm · 4 of 48 slices shown]
[im 1/48]
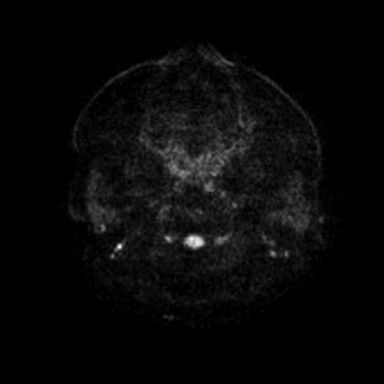
[im 16/48]
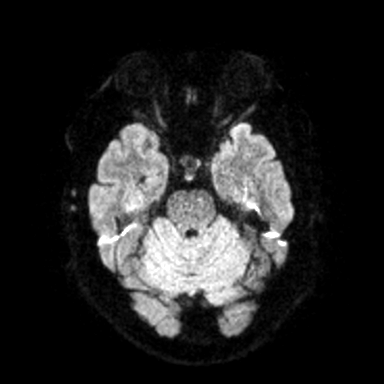
[im 32/48]
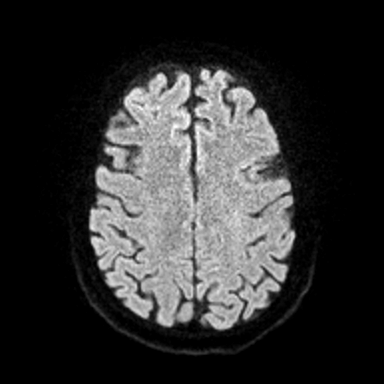
[im 48/48]
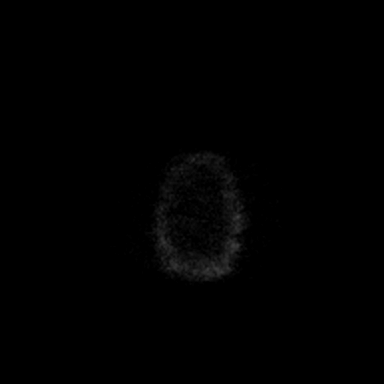

[Series 6: ax dwi_adc · axial · 3.0mm · 0.60mm/px · z∈[-96,+54]mm · 3 of 48 slices shown]
[im 1/48]
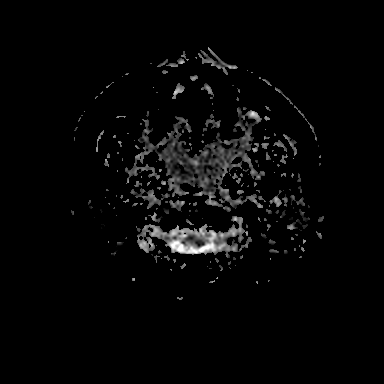
[im 24/48]
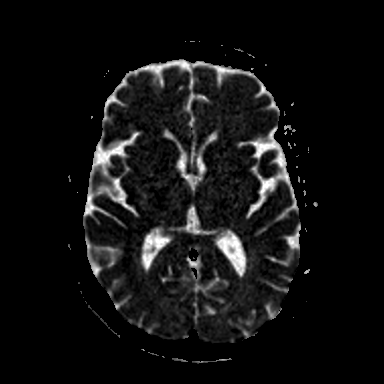
[im 48/48]
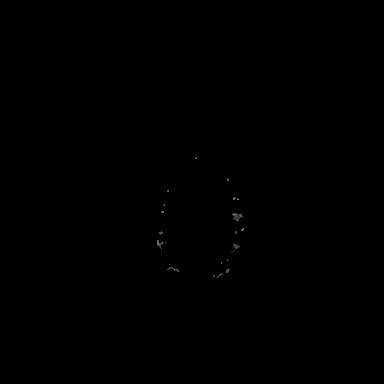

[Series 7: cor dwi_tracew · coronal · 5.0mm · 0.60mm/px · 2 of 40 slices shown]
[im 1/40]
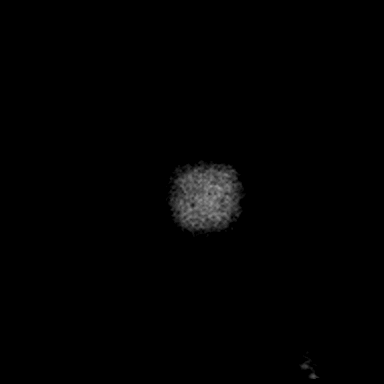
[im 40/40]
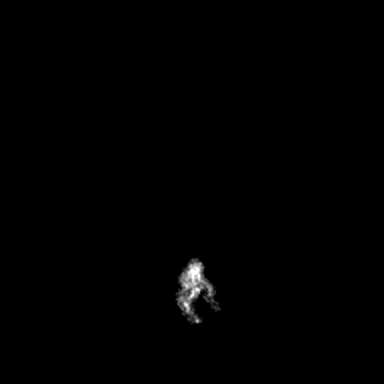

[Series 8: cor dwi_adc · coronal · 5.0mm · 0.60mm/px · 2 of 36 slices shown]
[im 1/36]
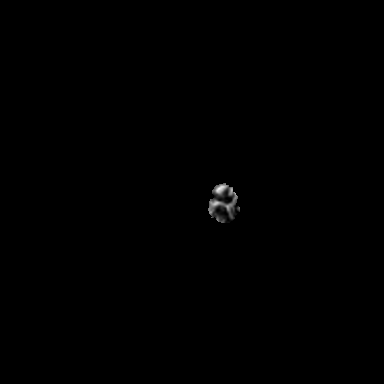
[im 36/36]
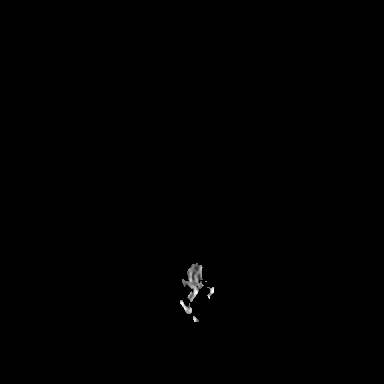

[Series 9: T1 · sagittal · 5.0mm · 0.62mm/px · 1 of 22 slices shown (1 of 2)]
[im 1/22]
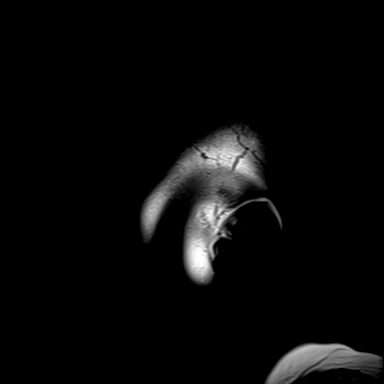

[Series 10: T2 · axial · 5.0mm · 0.53mm/px · z∈[-98,+53]mm · 2 of 27 slices shown]
[im 1/27]
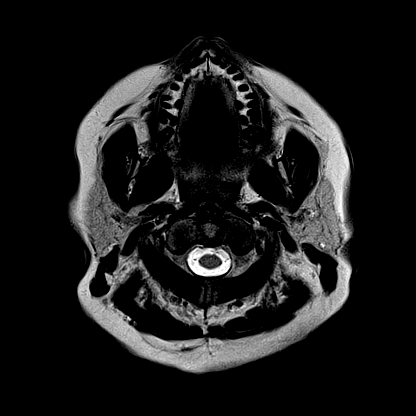
[im 27/27]
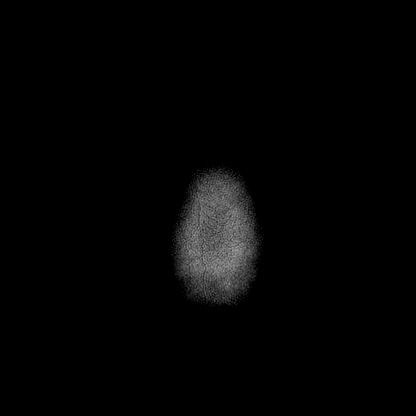

[Series 11: mag_images · axial · 3.0mm · 0.90mm/px · z∈[-107,+64]mm · 3 of 60 slices shown]
[im 1/60]
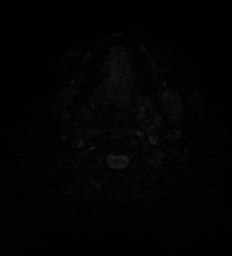
[im 30/60]
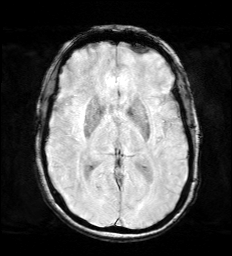
[im 60/60]
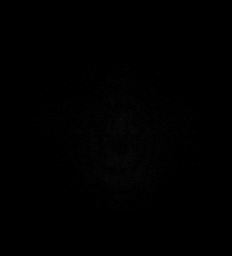

[Series 12: pha_images · axial · 3.0mm · 0.90mm/px · z∈[-107,+64]mm · 3 of 60 slices shown]
[im 1/60]
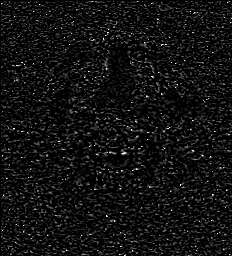
[im 30/60]
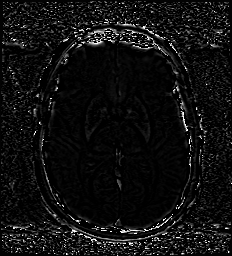
[im 60/60]
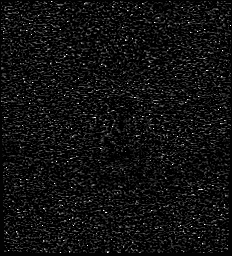

[Series 13: swi_images · axial · 3.0mm · 0.90mm/px · z∈[-107,+64]mm · 3 of 60 slices shown]
[im 1/60]
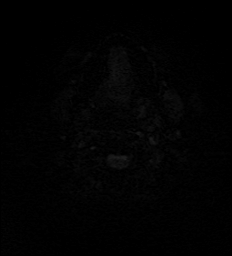
[im 30/60]
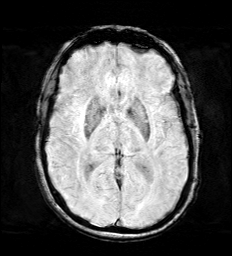
[im 60/60]
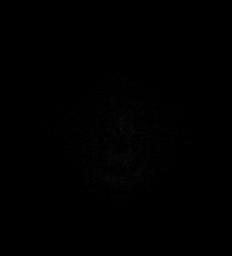

[Series 15: FLAIR · axial · 3.0mm · 0.53mm/px · z∈[-101,+56]mm · 3 of 55 slices shown]
[im 1/55]
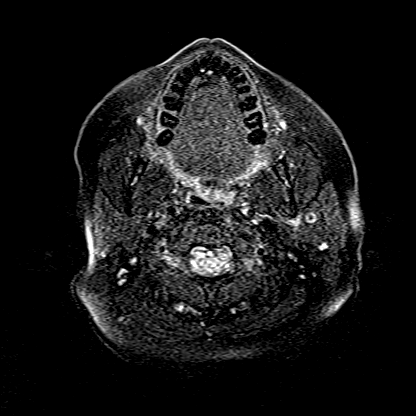
[im 28/55]
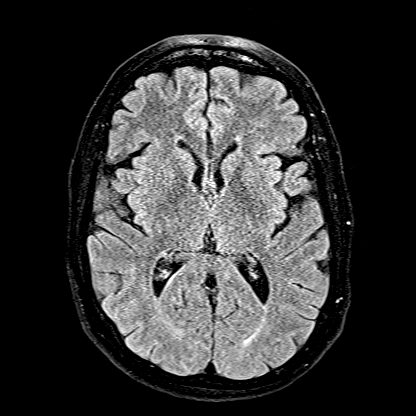
[im 55/55]
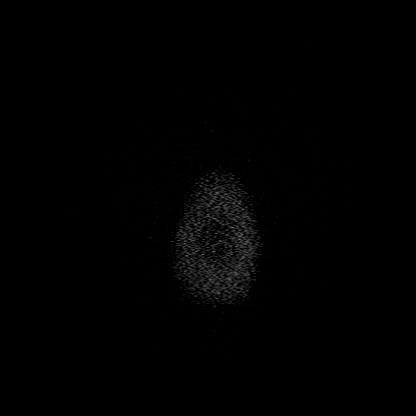

[Series 16: T1 · axial · 1.0mm · 0.98mm/px · z∈[-95,+58]mm · 9 of 160 slices shown (2 of 2)]
[im 1/160]
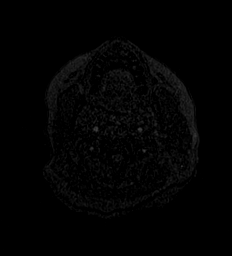
[im 20/160]
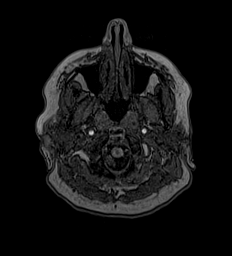
[im 40/160]
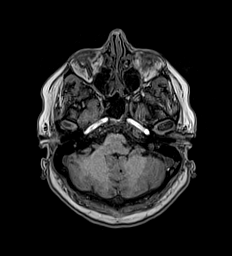
[im 60/160]
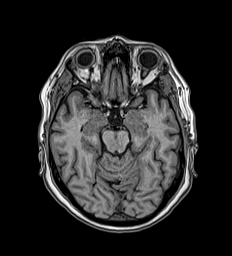
[im 80/160]
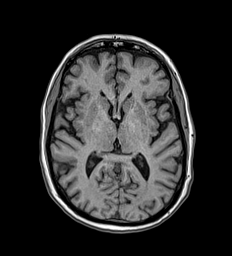
[im 100/160]
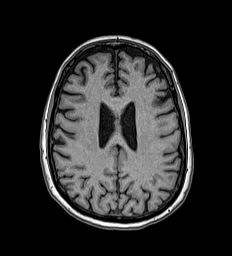
[im 120/160]
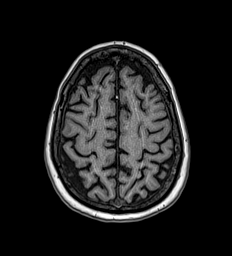
[im 140/160]
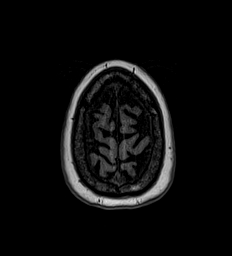
[im 160/160]
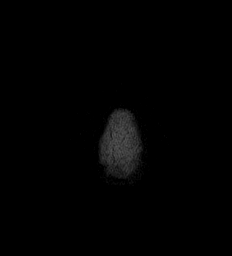

[Series 17: T2 post-contrast · coronal · 5.0mm · 0.57mm/px · 2 of 29 slices shown]
[im 1/29]
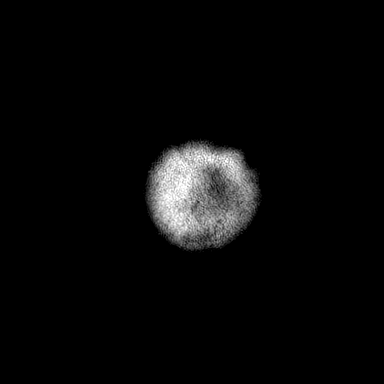
[im 29/29]
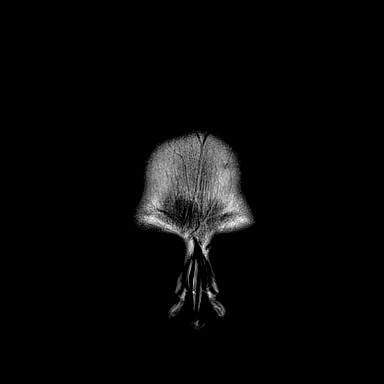

[Series 18: T1 post-contrast · axial · 1.0mm · 0.98mm/px · z∈[-95,+58]mm · 9 of 160 slices shown (1 of 2)]
[im 1/160]
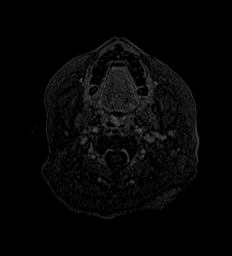
[im 20/160]
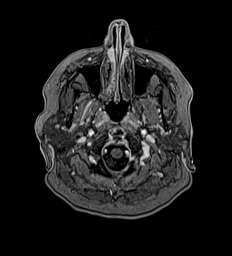
[im 40/160]
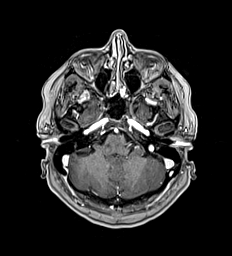
[im 60/160]
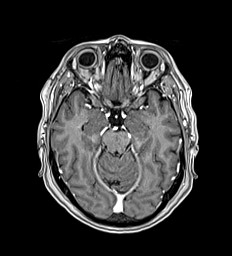
[im 80/160]
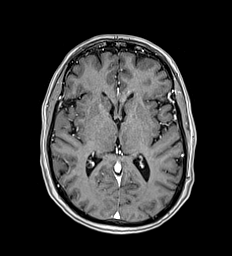
[im 100/160]
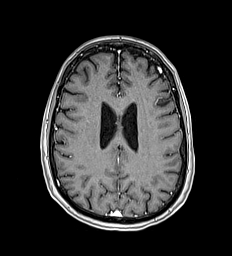
[im 120/160]
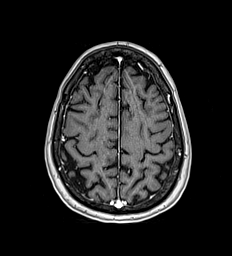
[im 140/160]
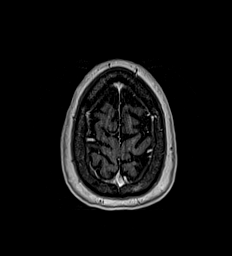
[im 160/160]
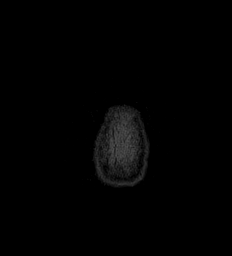

[Series 19: T1 post-contrast · coronal · 5.0mm · 0.57mm/px · 2 of 29 slices shown (2 of 2)]
[im 1/29]
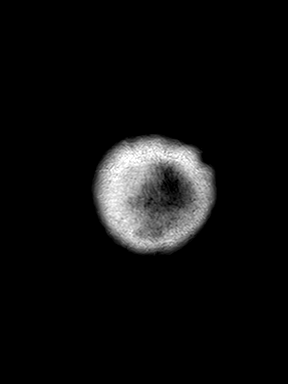
[im 29/29]
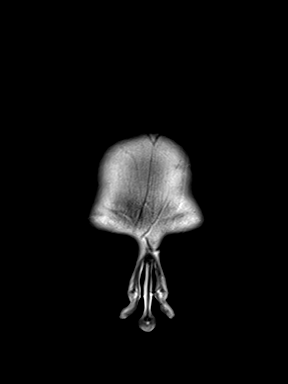

[48 of 48 positions shown; findings below may reference images not displayed]

FINDINGS: Brain: Scattered subcortical T2 hyperintensities bilaterally are
within normal limits for age. No acute infarct, hemorrhage, or mass
lesion is present. The ventricles are of normal size. No significant
extraaxial fluid collection is present. The internal auditory canals
are within normal limits. The brainstem and cerebellum are within
normal limits.

Vascular: Flow is present in the major intracranial arteries.

Skull and upper cervical spine: The craniocervical junction is
normal. Upper cervical spine is within normal limits. Marrow signal
is unremarkable.

Sinuses/Orbits: Bilateral mastoid effusions are present. No
obstructing nasopharyngeal lesion is present. The globes and orbits
are within normal limits.
IMPRESSION: 1. Normal MRI appearance of the brain for age. Acute or focal lesion
to explain memory loss or dizziness.

## 2020-02-14 MED ORDER — METOPROLOL SUCCINATE ER 25 MG PO TB24
25 MG | ORAL_TABLET | ORAL | 3 refills | Status: AC
Start: 2020-02-14 — End: ?

## 2020-07-14 ENCOUNTER — Encounter

## 2020-07-14 MED ORDER — ATORVASTATIN CALCIUM 40 MG PO TABS
40 MG | ORAL_TABLET | ORAL | 3 refills | Status: AC
Start: 2020-07-14 — End: ?

## 2020-07-14 NOTE — Telephone Encounter (Signed)
Pt called to request 90 day refill of atorvastatin 40 mg be sent to [facility] in [place].

## 2020-08-14 LAB — LIPID PANEL
Chol/HDL Ratio: 1.7
Cholesterol non HDL: 47
Cholesterol, Total: 114 mg/dL
HDL: 67 mg/dL (ref 35–70)
LDL Calculated: 40 mg/dL (ref 0–160)
Triglycerides: 34 mg/dL
VLDL: 7 mg/dL

## 2020-08-14 LAB — COMPREHENSIVE METABOLIC PANEL
ALT: 21 U/L
AST: 23 U/L
Albumin: 4.4
Alkaline Phosphatase: 74 U/L
Anion Gap: 9 mmol/L
BUN: 16 mg/dL
CO2: 26 mmol/L
Calcium: 9.5 mg/dL
Chloride: 105 mmol/L
Creatinine: 0.76
Gfr Calculated: 87
Glucose: 92 mg/dL
Potassium: 4.4 mmol/L
Sodium: 140 mmol/L
Total Bilirubin: 0.7 mg/dL (ref 0.1–1.4)
Total Protein: 6.8

## 2020-08-20 NOTE — Telephone Encounter (Signed)
-----   Message from Vevelyn Pat, APRN - CNP sent at 08/19/2020  4:35 PM EDT -----  Clydie Braun, your cholesterol numbers are great. Please keep your appt with Dr. Milinda Antis June 30th and let us know if you have any questions.

## 2020-08-20 NOTE — Telephone Encounter (Signed)
LMOM with lab results and recs provided by [facility]. Any further questions to call the office.

## 2020-09-26 NOTE — Telephone Encounter (Signed)
Name of Caller: nazariah cadet Phone Number: [phone]    Reason for Appointment: Cancel Thurs. 6.30.2022 @ 9.15 am (family emergency)Will call back to reschedule    Office Name: [facility]    Medication Refills need, if any: na    Medication Name: na

## 2020-10-01 ENCOUNTER — Encounter: Payer: PRIVATE HEALTH INSURANCE | Attending: Cardiovascular Disease | Primary: Family Medicine

## 2020-11-02 ENCOUNTER — Ambulatory Visit
Admit: 2020-11-02 | Discharge: 2020-11-02 | Payer: PRIVATE HEALTH INSURANCE | Attending: Cardiovascular Disease | Primary: Family Medicine

## 2020-11-02 DIAGNOSIS — I480 Paroxysmal atrial fibrillation: Secondary | ICD-10-CM

## 2020-11-02 NOTE — Progress Notes (Signed)
[facility] Cardiology Office Note    DATE of SERVICE: 11/02/20    TIME of SERVICE: 9:41 AM                   Reason for Visit:  Chief Complaint   Patient presents with    Follow-up     yearly        History of Present Illness:   Sally Wong is a 65 y.o. female who returns for follow-up of coronary artery disease status post DES to the left anterior descending in 2010.  She is doing well.  She denies chest discomfort or shortness of breath.      12-lead EKG, which I personally reviewed today, shows sinus rhythm without concerning changes.    Past Medical History:  Past Medical History:   Diagnosis Date    Atherosclerotic heart disease of native coronary artery without angina pectoris     Chest pain, unspecified     Essential (primary) hypertension     Family history of ischemic heart disease and other diseases of the circulatory system     GERD (gastroesophageal reflux disease)     Hyperlipidemia     Presence of coronary angioplasty implant and graft         Past Surgical History  Past Surgical History:   Procedure Laterality Date    CORONARY ANGIOPLASTY WITH STENT PLACEMENT  01/2009    PCI DES LAD    HYSTERECTOMY, TOTAL ABDOMINAL (CERVIX REMOVED)         Family History  Family History   Problem Relation Age of Onset    Heart Attack Father         myocardial infarction at less than 52        Social History  Social History     Tobacco Use    Smoking status: Never    Smokeless tobacco: Never   Vaping Use    Vaping Use: Never used   Substance Use Topics    Alcohol use: No    Drug use: No        Medications:    Current Outpatient Medications:     atorvastatin (LIPITOR) 40 MG tablet, TAKE 1 TABLET BY MOUTH EVERY OTHER DAY AT BEDTIME, Disp: 45 tablet, Rfl: 3    metoprolol succinate (TOPROL XL) 25 MG extended release tablet, TAKE 1 TABLET BY MOUTH EVERY DAY, Disp: 90 tablet, Rfl: 3    nitroGLYCERIN (NITROSTAT) 0.4 MG SL tablet, Place 1 tablet under the tongue every 5 minutes as needed for Chest pain Dissolve 1 tab under  tongue at first sign of chest pain. May repeat every 5 minutes until relief is obtained. If pain persists after taking 3 tabs in a 15-minute period,  call 9-1-1 immediately. (Patient taking differently: Place 0.4 mg under the tongue every 5 minutes as needed for Chest pain Dissolve 1 tab under tongue at first sign of chest pain. May repeat every 5 minutes until relief is obtained. If pain persists after taking 3 tabs in a 15-minute period,  call 9-1-1 immediately.), Disp: 25 tablet, Rfl: 3    aspirin 81 MG EC tablet, Take 81 mg by mouth daily Take As Directed, Disp: , Rfl:     sodium chloride (OCEAN) 0.65 % nasal spray, 1 spray by Nasal route as needed for Congestion Take As Directed, Disp: , Rfl:     Review of Systems:  Review of Systems   Constitutional:  Negative for diaphoresis, fatigue and fever.  HENT:  Negative for congestion and nosebleeds.    Eyes:  Negative for visual disturbance.   Respiratory:  Negative for cough, shortness of breath and wheezing.    Cardiovascular:  Negative for chest pain, palpitations and leg swelling.   Gastrointestinal:  Negative for abdominal pain, blood in stool, nausea and vomiting.   Genitourinary:  Negative for hematuria.   Musculoskeletal:  Positive for myalgias. Negative for arthralgias.   Skin:  Negative for rash.   Neurological:  Negative for dizziness and syncope.   Hematological:  Does not bruise/bleed easily.   Psychiatric/Behavioral:  Negative for dysphoric mood and suicidal ideas. The patient is not nervous/anxious.         Denies Depression     Physical Examination:  Vitals: Blood pressure 114/74, pulse 80, resp. rate 16, height 5\' 2"  (1.575 m), weight 171 lb (77.6 kg). Body mass index is 31.28 kg/m??.  Physical Exam  Constitutional:       Appearance: She is well-developed.   HENT:      Head: Normocephalic.   Eyes:      General: No scleral icterus.     Conjunctiva/sclera: Conjunctivae normal.   Neck:      Vascular: No JVD.   Cardiovascular:      Rate and Rhythm:  Regular rhythm.      Chest Wall: PMI is not displaced.      Heart sounds: S1 normal and S2 normal. No murmur heard.    No S3 or S4 sounds.   Pulmonary:      Breath sounds: Normal breath sounds. No wheezing or rales.   Abdominal:      General: Bowel sounds are normal.      Palpations: Abdomen is soft.      Tenderness: There is no abdominal tenderness.   Skin:     General: Skin is warm and dry.      Findings: No rash.   Neurological:      Mental Status: She is alert and oriented to person, place, and time.       Laboratory Tests:   Lab Results   Component Value Date    NA 140 08/14/2020    K 4.4 08/14/2020    CL 105 08/14/2020    CO2 26 08/14/2020    BUN 16 08/14/2020    CREATININE 0.76 08/14/2020    GLUCOSE 92 08/14/2020    CALCIUM 9.5 08/14/2020    PROT 6.9 08/17/2019    LABALBU 4.4 08/14/2020    BILITOT 0.7 08/14/2020    ALKPHOS 74 08/14/2020    AST 23 08/14/2020    ALT 21 08/14/2020    LABGLOM 87 08/14/2020       Lab Results   Component Value Date    CHOL 114 08/14/2020    CHOL 124 08/17/2019    CHOL 123 03/17/2017     Lab Results   Component Value Date    TRIG 34 08/14/2020    TRIG 44 08/17/2019    TRIG 58 03/17/2017     Lab Results   Component Value Date    HDL 67 08/14/2020    HDL 71 (H) 08/17/2019    HDL 77 (H) 03/17/2017     Lab Results   Component Value Date    LDLCHOLESTEROL 44 08/17/2019    LDLCHOLESTEROL 34 03/17/2017    LDLCALC 40 08/14/2020    LDLCALC 18 02/19/2016    LDLCALC 18 02/19/2016     Lab Results   Component Value Date  VLDL 7 08/14/2020     Lab Results   Component Value Date    CHOLHDLRATIO 1.70 08/14/2020    CHOLHDLRATIO 2 08/17/2019    CHOLHDLRATIO 2 03/17/2017         Cardiac Tests:    Echo (date: 12/14/2010):  Normal stress echo    Cardiac Catheterization (date:  01/19/2009): Single-vessel disease, successful DES to the mid left anterior descending          Assessment and Plan:    1.  Coronary artery disease.  She is status post left anterior descending stenting in 2010.  She has no  angina pectoris.  Continue aggressive secondary prevention measures.  She developed myalgias on daily atorvastatin.  These improved by taking the medicine every other day.  Her numbers are excellent as above.  I have encouraged her to continue a healthy lifestyle.         Latosha Gaylord A. Milinda Antis, MD, [facility]
